# Patient Record
Sex: Female | Born: 1960 | Race: White | Hispanic: No | Marital: Married | Smoking: Former smoker
Health system: Southern US, Community
[De-identification: ages and names within clinical notes are randomized; demographics above are authoritative.]

## PROBLEM LIST (undated history)

## (undated) DIAGNOSIS — T8859XA Other complications of anesthesia, initial encounter: Secondary | ICD-10-CM

## (undated) DIAGNOSIS — I10 Essential (primary) hypertension: Secondary | ICD-10-CM

## (undated) DIAGNOSIS — E785 Hyperlipidemia, unspecified: Secondary | ICD-10-CM

## (undated) DIAGNOSIS — C801 Malignant (primary) neoplasm, unspecified: Secondary | ICD-10-CM

## (undated) DIAGNOSIS — B019 Varicella without complication: Secondary | ICD-10-CM

## (undated) DIAGNOSIS — T4145XA Adverse effect of unspecified anesthetic, initial encounter: Secondary | ICD-10-CM

## (undated) HISTORY — DX: Essential (primary) hypertension: I10

## (undated) HISTORY — PX: COLONOSCOPY: SHX174

## (undated) HISTORY — PX: NOVASURE ABLATION: SHX5394

## (undated) HISTORY — DX: Hyperlipidemia, unspecified: E78.5

## (undated) HISTORY — DX: Varicella without complication: B01.9

---

## 2002-10-21 HISTORY — PX: ABLATION: SHX5711

## 2004-01-31 ENCOUNTER — Other Ambulatory Visit: Admission: RE | Admit: 2004-01-31 | Discharge: 2004-01-31 | Payer: Self-pay | Admitting: Obstetrics and Gynecology

## 2006-01-07 ENCOUNTER — Ambulatory Visit: Payer: Self-pay | Admitting: Obstetrics and Gynecology

## 2006-01-17 ENCOUNTER — Ambulatory Visit: Payer: Self-pay | Admitting: Obstetrics and Gynecology

## 2007-02-24 ENCOUNTER — Ambulatory Visit: Payer: Self-pay | Admitting: Obstetrics and Gynecology

## 2008-02-26 ENCOUNTER — Ambulatory Visit: Payer: Self-pay | Admitting: Obstetrics and Gynecology

## 2009-03-16 ENCOUNTER — Ambulatory Visit: Payer: Self-pay | Admitting: Obstetrics and Gynecology

## 2010-05-21 ENCOUNTER — Ambulatory Visit: Payer: Self-pay | Admitting: Internal Medicine

## 2010-06-06 ENCOUNTER — Ambulatory Visit: Payer: Self-pay | Admitting: Internal Medicine

## 2010-06-13 ENCOUNTER — Ambulatory Visit: Payer: Self-pay | Admitting: Obstetrics and Gynecology

## 2010-06-21 ENCOUNTER — Ambulatory Visit: Payer: Self-pay | Admitting: Internal Medicine

## 2010-07-21 ENCOUNTER — Ambulatory Visit: Payer: Self-pay | Admitting: Internal Medicine

## 2011-08-19 ENCOUNTER — Ambulatory Visit: Payer: Self-pay | Admitting: Obstetrics and Gynecology

## 2012-07-21 LAB — HM MAMMOGRAPHY: HM Mammogram: NORMAL

## 2012-07-26 LAB — HM PAP SMEAR: HM Pap smear: NORMAL

## 2012-08-19 ENCOUNTER — Ambulatory Visit: Payer: Self-pay | Admitting: Obstetrics and Gynecology

## 2012-08-26 ENCOUNTER — Encounter: Payer: Self-pay | Admitting: Internal Medicine

## 2012-08-26 ENCOUNTER — Ambulatory Visit (INDEPENDENT_AMBULATORY_CARE_PROVIDER_SITE_OTHER): Payer: BC Managed Care – PPO | Admitting: Internal Medicine

## 2012-08-26 ENCOUNTER — Telehealth: Payer: Self-pay | Admitting: Internal Medicine

## 2012-08-26 VITALS — BP 150/90 | HR 75 | Temp 97.8°F | Ht 66.5 in | Wt 145.5 lb

## 2012-08-26 DIAGNOSIS — L259 Unspecified contact dermatitis, unspecified cause: Secondary | ICD-10-CM

## 2012-08-26 DIAGNOSIS — I10 Essential (primary) hypertension: Secondary | ICD-10-CM | POA: Insufficient documentation

## 2012-08-26 DIAGNOSIS — Z23 Encounter for immunization: Secondary | ICD-10-CM

## 2012-08-26 DIAGNOSIS — L309 Dermatitis, unspecified: Secondary | ICD-10-CM | POA: Insufficient documentation

## 2012-08-26 DIAGNOSIS — Z0000 Encounter for general adult medical examination without abnormal findings: Secondary | ICD-10-CM

## 2012-08-26 LAB — CBC WITH DIFFERENTIAL/PLATELET
Basophils Absolute: 0 10*3/uL (ref 0.0–0.1)
Eosinophils Absolute: 0.1 10*3/uL (ref 0.0–0.7)
HCT: 40.7 % (ref 36.0–46.0)
Hemoglobin: 13.5 g/dL (ref 12.0–15.0)
Lymphs Abs: 2 10*3/uL (ref 0.7–4.0)
MCHC: 33.2 g/dL (ref 30.0–36.0)
MCV: 86.4 fl (ref 78.0–100.0)
Monocytes Absolute: 0.5 10*3/uL (ref 0.1–1.0)
Monocytes Relative: 7.8 % (ref 3.0–12.0)
Neutro Abs: 3.6 10*3/uL (ref 1.4–7.7)
RDW: 13.4 % (ref 11.5–14.6)

## 2012-08-26 LAB — COMPREHENSIVE METABOLIC PANEL
ALT: 16 U/L (ref 0–35)
AST: 16 U/L (ref 0–37)
Alkaline Phosphatase: 36 U/L — ABNORMAL LOW (ref 39–117)
Potassium: 4 mEq/L (ref 3.5–5.1)
Sodium: 141 mEq/L (ref 135–145)
Total Bilirubin: 0.6 mg/dL (ref 0.3–1.2)
Total Protein: 7 g/dL (ref 6.0–8.3)

## 2012-08-26 LAB — TSH: TSH: 0.88 u[IU]/mL (ref 0.35–5.50)

## 2012-08-26 LAB — LIPID PANEL: VLDL: 16.4 mg/dL (ref 0.0–40.0)

## 2012-08-26 MED ORDER — TRIAMCINOLONE ACETONIDE 0.1 % EX CREA: TOPICAL_CREAM | Freq: Two times a day (BID) | CUTANEOUS | Status: DC

## 2012-08-26 MED ORDER — ZOSTER VACCINE LIVE 19400 UNT/0.65ML ~~LOC~~ SOLR: 0.6500 mL | Freq: Once | SUBCUTANEOUS | Status: DC

## 2012-08-26 NOTE — Telephone Encounter (Signed)
EKG and labs normal

## 2012-08-26 NOTE — Assessment & Plan Note (Signed)
Patient will monitor blood pressure at home in one to 2 times per week and e-mail with readings. If blood pressure consistently greater than 140/90, will plan to start medication. Followup one.

## 2012-08-26 NOTE — Progress Notes (Signed)
Subjective:    Patient ID: Andrea Arnold, female    DOB: 01/21/1961, 51 y.o.   MRN: [id number]  HPI 51 year old female presents to establish care. She reports that she has been generally healthy with no major ongoing medical issues. However, over the last several years she has intermittently had some elevated blood pressure readings. She denies any headache, palpitations, chest pain. She does report significant stress in her life with ongoing issues at work. She has never taken medication for anxiety or stress.   She is concerned today about rash over her left hand and neck. This is been present for several months. She was seen at urgent care and prescribed a topical antihistamine cream. She reports no improvement with this. She reports that the rash is red and peeling and occasionally itchy. She denies use of a new lotions, soaps, detergents. She has not recently been seen by a dermatologist.  Outpatient Encounter Prescriptions as of 08/26/2012  Medication Sig Dispense Refill  . triamcinolone cream (KENALOG) 0.1 % Apply topically 2 (two) times daily.  30 g  0  . Vitamin D, Ergocalciferol, (DRISDOL) 50000 UNITS CAPS Take 50,000 Units by mouth every 7 (seven) days.      Marland Kitchen zoster vaccine live, PF, (ZOSTAVAX) 40981 UNT/0.65ML injection Inject 19,400 Units into the skin once.  1 each  0   BP 150/90  Pulse 75  Temp 97.8 F (36.6 C) (Oral)  Ht 5' 6.5" (1.689 m)  Wt 145 lb 8 oz (65.998 kg)  BMI 23.13 kg/m2  SpO2 97%  Review of Systems  Constitutional: Negative for fever, chills, appetite change, fatigue and unexpected weight change.  HENT: Negative for ear pain, congestion, sore throat, trouble swallowing, neck pain, voice change and sinus pressure.   Eyes: Negative for visual disturbance.  Respiratory: Negative for cough, shortness of breath, wheezing and stridor.   Cardiovascular: Negative for chest pain, palpitations and leg swelling.  Gastrointestinal: Negative for nausea, vomiting,  abdominal pain, diarrhea, constipation, blood in stool, abdominal distention and anal bleeding.  Genitourinary: Negative for dysuria and flank pain.  Musculoskeletal: Negative for myalgias, arthralgias and gait problem.  Skin: Positive for color change and rash.  Neurological: Negative for dizziness and headaches.  Hematological: Negative for adenopathy. Does not bruise/bleed easily.  Psychiatric/Behavioral: Negative for suicidal ideas, sleep disturbance and dysphoric mood. The patient is nervous/anxious.        Objective:   Physical Exam  Constitutional: She is oriented to person, place, and time. She appears well-developed and well-nourished. No distress.  HENT:  Head: Normocephalic and atraumatic.  Right Ear: External ear normal.  Left Ear: External ear normal.  Nose: Nose normal.  Mouth/Throat: Oropharynx is clear and moist. No oropharyngeal exudate.  Eyes: Conjunctivae normal are normal. Pupils are equal, round, and reactive to light. Right eye exhibits no discharge. Left eye exhibits no discharge. No scleral icterus.  Neck: Normal range of motion. Neck supple. No tracheal deviation present. No thyromegaly present.  Cardiovascular: Normal rate, regular rhythm, normal heart sounds and intact distal pulses.  Exam reveals no gallop and no friction rub.   No murmur heard. Pulmonary/Chest: Effort normal and breath sounds normal. No respiratory distress. She has no wheezes. She has no rales. She exhibits no tenderness.  Abdominal: Soft. Bowel sounds are normal. She exhibits no distension and no mass. There is no tenderness. There is no rebound and no guarding.  Musculoskeletal: Normal range of motion. She exhibits no edema and no tenderness.  Lymphadenopathy:    She  has no cervical adenopathy.  Neurological: She is alert and oriented to person, place, and time. No cranial nerve deficit. She exhibits normal muscle tone. Coordination normal.  Skin: Skin is warm and dry. Rash noted. Rash is  papular (erythematous scaling rash over left hand and right posterior neck). She is not diaphoretic. There is erythema. No pallor.  Psychiatric: She has a normal mood and affect. Her behavior is normal. Judgment and thought content normal.          Assessment & Plan:

## 2012-08-26 NOTE — Assessment & Plan Note (Signed)
Rash over her left hand and right posterior neck are most consistent with psoriasis. Will try adding topical triamcinolone cream. We'll also set up dermatology referral.

## 2012-09-04 ENCOUNTER — Encounter: Payer: Self-pay | Admitting: Internal Medicine

## 2012-09-04 DIAGNOSIS — Z23 Encounter for immunization: Secondary | ICD-10-CM

## 2012-09-07 ENCOUNTER — Encounter: Payer: Self-pay | Admitting: Internal Medicine

## 2012-09-07 MED ORDER — ZOSTER VACCINE LIVE 19400 UNT/0.65ML ~~LOC~~ SOLR: 0.6500 mL | Freq: Once | SUBCUTANEOUS | Status: DC

## 2012-09-23 ENCOUNTER — Ambulatory Visit (INDEPENDENT_AMBULATORY_CARE_PROVIDER_SITE_OTHER): Payer: BC Managed Care – PPO | Admitting: Internal Medicine

## 2012-09-23 ENCOUNTER — Encounter: Payer: Self-pay | Admitting: Internal Medicine

## 2012-09-23 VITALS — BP 122/82 | HR 72 | Temp 98.1°F | Resp 16 | Wt 148.5 lb

## 2012-09-23 DIAGNOSIS — E785 Hyperlipidemia, unspecified: Secondary | ICD-10-CM | POA: Insufficient documentation

## 2012-09-23 DIAGNOSIS — R9431 Abnormal electrocardiogram [ECG] [EKG]: Secondary | ICD-10-CM | POA: Insufficient documentation

## 2012-09-23 MED ORDER — ATORVASTATIN CALCIUM 10 MG PO TABS: 10.0000 mg | ORAL_TABLET | Freq: Every day | ORAL | Status: DC

## 2012-09-23 NOTE — Assessment & Plan Note (Signed)
LDL 145. We discussed both the ATPIII and new ATP IV guidelines for management of hyperlipidemia. Based on ATP IV risk calculator, pt has 1.2% risk of ASCVD over next 10 years.  She follows a healthy diet and exercises regularly. We discussed more aggressive dietary measures including limiting saturated fat and increasing intake of fiber.  Given her concern about family risk of ASCVD and likely familial HL, will start low dose atorvastatin 10mg  daily for primary prevention. Pt will have lipids and LFTs rechecked in 1-3 months.

## 2012-09-23 NOTE — Progress Notes (Signed)
Subjective:    Patient ID: Andrea Arnold, female    DOB: September 01, 1961, 51 y.o.   MRN: [id number]  HPI 51 year old female with history of hypertension presents for followup. She reports that blood pressure has been well-controlled without medication. Most blood pressures when checked near 120/80. She denies chest pain, palpitations, headache. She follows an active lifestyle and exercises for 30-60 minutes most days of the week. At her last visit, she had blood work including cholesterol checked which showed elevation of LDL at 145. She also had baseline EKG performed. EKG showed bifascicular block. She reports that she discussed these findings with her father who is a cardiologist. He felt strongly that she needs to be evaluated further for coronary artery disease. He also felt that she should be placed on a statin medication. She is a strong family history of heart disease.  At her last visit, she also complained of itchy rash over her neck and hands. She reports that she was seen by dermatology and they changed her steroid cream to a stronger medication. She reports rash is completely resolve.  Outpatient Encounter Prescriptions as of 09/23/2012  Medication Sig Dispense Refill  . clobetasol cream (TEMOVATE) 0.05 %       . Vitamin D, Ergocalciferol, (DRISDOL) 50000 UNITS CAPS Take 50,000 Units by mouth every 7 (seven) days.      Marland Kitchen zoster vaccine live, PF, (ZOSTAVAX) 78295 UNT/0.65ML injection Inject 19,400 Units into the skin once.  1 each  0     Review of Systems  Constitutional: Negative for fever, chills, appetite change, fatigue and unexpected weight change.  HENT: Negative for ear pain, congestion, sore throat, trouble swallowing, neck pain, voice change and sinus pressure.   Eyes: Negative for visual disturbance.  Respiratory: Negative for cough, shortness of breath, wheezing and stridor.   Cardiovascular: Negative for chest pain, palpitations and leg swelling.  Gastrointestinal: Negative  for nausea, vomiting, abdominal pain, diarrhea, constipation, blood in stool, abdominal distention and anal bleeding.  Genitourinary: Negative for dysuria and flank pain.  Musculoskeletal: Negative for myalgias, arthralgias and gait problem.  Skin: Negative for color change and rash.  Neurological: Negative for dizziness and headaches.  Hematological: Negative for adenopathy. Does not bruise/bleed easily.  Psychiatric/Behavioral: Negative for suicidal ideas, sleep disturbance and dysphoric mood. The patient is not nervous/anxious.    BP 122/82  Pulse 72  Temp 98.1 F (36.7 C) (Oral)  Resp 16  Wt 148 lb 8 oz (67.359 kg)     Objective:   Physical Exam  Constitutional: She is oriented to person, place, and time. She appears well-developed and well-nourished. No distress.  HENT:  Head: Normocephalic and atraumatic.  Right Ear: External ear normal.  Left Ear: External ear normal.  Nose: Nose normal.  Mouth/Throat: Oropharynx is clear and moist. No oropharyngeal exudate.  Eyes: Conjunctivae normal are normal. Pupils are equal, round, and reactive to light. Right eye exhibits no discharge. Left eye exhibits no discharge. No scleral icterus.  Neck: Normal range of motion. Neck supple. No tracheal deviation present. No thyromegaly present.  Cardiovascular: Normal rate, regular rhythm, normal heart sounds and intact distal pulses.  Exam reveals no gallop and no friction rub.   No murmur heard. Pulmonary/Chest: Effort normal and breath sounds normal. No respiratory distress. She has no wheezes. She has no rales. She exhibits no tenderness.  Musculoskeletal: Normal range of motion. She exhibits no edema and no tenderness.  Lymphadenopathy:    She has no cervical adenopathy.  Neurological: She is  alert and oriented to person, place, and time. No cranial nerve deficit. She exhibits normal muscle tone. Coordination normal.  Skin: Skin is warm and dry. No rash noted. She is not diaphoretic. No  erythema. No pallor.  Psychiatric: She has a normal mood and affect. Her behavior is normal. Judgment and thought content normal.          Assessment & Plan:

## 2012-09-23 NOTE — Assessment & Plan Note (Signed)
Reviewed EKG with patient today. EKG shows bifascicular block. We discussed that this can be a normal finding. Given her concern about familial risk of cardiovascular disease, will set up cardiology evaluation. Patient would like to have further risk stratification with stress test.

## 2012-09-24 ENCOUNTER — Encounter: Payer: Self-pay | Admitting: Internal Medicine

## 2012-09-28 ENCOUNTER — Ambulatory Visit: Payer: BC Managed Care – PPO | Admitting: Cardiovascular Disease

## 2012-10-01 ENCOUNTER — Encounter: Payer: Self-pay | Admitting: Cardiovascular Disease

## 2012-10-01 ENCOUNTER — Ambulatory Visit (INDEPENDENT_AMBULATORY_CARE_PROVIDER_SITE_OTHER): Payer: BC Managed Care – PPO | Admitting: Cardiovascular Disease

## 2012-10-01 VITALS — BP 122/82 | HR 67 | Ht 66.5 in | Wt 146.5 lb

## 2012-10-01 VITALS — BP 124/92 | HR 67 | Ht 66.5 in | Wt 146.5 lb

## 2012-10-01 DIAGNOSIS — I10 Essential (primary) hypertension: Secondary | ICD-10-CM

## 2012-10-01 DIAGNOSIS — R9431 Abnormal electrocardiogram [ECG] [EKG]: Secondary | ICD-10-CM

## 2012-10-01 DIAGNOSIS — E785 Hyperlipidemia, unspecified: Secondary | ICD-10-CM

## 2012-10-01 NOTE — Assessment & Plan Note (Signed)
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 Overall no  symptoms of angina, heart failure or arrhythmia. Her cardiac physical exam is normal. Given her risk factors and family history of CAD, I recommend a treadmill stress test for evaluation.

## 2012-10-01 NOTE — Patient Instructions (Addendum)
Your physician has requested that you have an exercise tolerance test. For further information please visit [email or web address]. Please also follow instruction sheet, as given.  Follow up as needed.  

## 2012-10-01 NOTE — Patient Instructions (Addendum)
Your stress test is normal.  Follow up as needed.  

## 2012-10-01 NOTE — Procedures (Signed)
    Treadmill Stress test  Indication: Abnormal ECG.  Baseline Data:  Resting EKG shows NSR with rate of 65 bpm, left anterior fascicular block. Resting blood pressure of 122/82 mm Hg Stand bruce protocal was used.  Exercise Data:  Patient exercised for 9 min 25 sec,  Peak heart rate of 165 bpm.  This was 98 % of the maximum predicted heart rate. No symptoms of chest pain or lightheadedness were reported at peak stress or in recovery.  Peak Blood pressure recorded was 164/88 Maximal work level: 10.1 METs.  Heart rate at 3 minutes in recovery was 80 bpm. BP response: Normal HR response: Normal  EKG with Exercise: Sinus tachycardia with no significant ST changes.  FINAL IMPRESSION: Normal exercise stress test. No significant EKG changes concerning for ischemia. Excellent exercise tolerance.

## 2012-10-01 NOTE — Assessment & Plan Note (Signed)
She was started recently on atorvastatin 10 mg daily and seems to be tolerating it well.

## 2012-10-01 NOTE — Progress Notes (Signed)
HPI  This is a 51 year old pleasant female who is referred by Dr. Dan Humphreys for an abnormal EKG. The patient has no previous cardiac history. She has known history of familial hyperlipidemia and family history of coronary artery disease. Both grandparents died of myocardial infarction in their early 32s. She has borderline hypertension but no history of diabetes. She was noted recently to have an abnormal ECG with incomplete right bundle branch block and left anterior fascicular block. She denies any chest pain. She exercises regularly with only occasional mild dyspnea. No palpitations, syncope or presyncope. She was started on atorvastatin recently without any reported side effects. Her father is a cardiologist in [place]. She works as a Radio producer at [employer].   No Known Allergies   Current Outpatient Prescriptions on File Prior to Visit  Medication Sig Dispense Refill  . atorvastatin (LIPITOR) 10 MG tablet Take 1 tablet (10 mg total) by mouth daily.  30 tablet  6  . clobetasol cream (TEMOVATE) 0.05 % 2 (two) times daily.       . Vitamin D, Ergocalciferol, (DRISDOL) 50000 UNITS CAPS Take 50,000 Units by mouth every 7 (seven) days.      Marland Kitchen zoster vaccine live, PF, (ZOSTAVAX) 57846 UNT/0.65ML injection Inject 19,400 Units into the skin once.  1 each  0     Past Medical History  Diagnosis Date  . Chicken pox   . Hypertension   . Hyperlipidemia      Past Surgical History  Procedure Date  . Ablation 2004  . Novasure ablation      Family History  Problem Relation Age of Onset  . Alcohol abuse Sister   . Drug abuse Sister   . Cancer Sister 13    ovarian   . Mental illness Sister   . Cancer Maternal Aunt     breast  . Hyperlipidemia Maternal Grandmother   . Heart disease Maternal Grandmother   . Hypertension Maternal Grandmother   . Hyperlipidemia Maternal Grandfather   . Heart disease Maternal Grandfather   . Hypertension Maternal Grandfather   . Cancer  Cousin     breast  . Hypertension Father   . Hyperlipidemia Mother      History   Social History  . Marital Status: Married    Spouse Name: N/A    Number of Children: N/A  . Years of Education: N/A   Occupational History  . Not on file.   Social History Main Topics  . Smoking status: Former Smoker -- 1.0 packs/day for 10 years    Types: Cigarettes  . Smokeless tobacco: Not on file  . Alcohol Use: No  . Drug Use: No  . Sexually Active: Not on file   Other Topics Concern  . Not on file   Social History Narrative   [place] and 2 daughters. 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  HENT: Negative for hearing loss, nosebleeds, congestion, sore throat, facial swelling, drooling, trouble swallowing, neck pain, voice change, sinus pressure and tinnitus.  Eyes: Negative for photophobia, pain, discharge and visual disturbance.  Respiratory: Negative for apnea, cough, chest tightness and wheezing.  Cardiovascular: Negative for chest pain, palpitations and leg swelling.  Gastrointestinal: Negative for nausea, vomiting, abdominal pain, diarrhea, constipation, blood in stool and abdominal distention.  Genitourinary: Negative for dysuria, urgency,  frequency, hematuria and decreased urine volume.  Musculoskeletal: Negative for myalgias, back pain, joint swelling, arthralgias and gait problem.  Skin: Negative for color change, pallor, rash and wound.  Neurological: Negative for dizziness, tremors, seizures, syncope, speech difficulty, weakness, light-headedness, numbness and headaches.  Psychiatric/Behavioral: Negative for suicidal ideas, hallucinations, behavioral problems and agitation. The patient is not nervous/anxious.     PHYSICAL EXAM   BP 124/92  Pulse 67  Ht 5' 6.5" (1.689  m)  Wt 146 lb 8 oz (66.452 kg)  BMI 23.29 kg/m2 Constitutional: She is oriented to person, place, and time. She appears well-developed and well-nourished. No distress.  HENT: No nasal discharge.  Head: Normocephalic and atraumatic.  Eyes: Pupils are equal and round. Right eye exhibits no discharge. Left eye exhibits no discharge.  Neck: Normal range of motion. Neck supple. No JVD present. No thyromegaly present.  Cardiovascular: Normal rate, regular rhythm, normal heart sounds. Exam reveals no gallop and no friction rub. No murmur heard.  Pulmonary/Chest: Effort normal and breath sounds normal. No stridor. No respiratory distress. She has no wheezes. She has no rales. She exhibits no tenderness.  Abdominal: Soft. Bowel sounds are normal. She exhibits no distension. There is no tenderness. There is no rebound and no guarding.  Musculoskeletal: Normal range of motion. She exhibits no edema and no tenderness.  Neurological: She is alert and oriented to person, place, and time. Coordination normal.  Skin: Skin is warm and dry. No rash noted. She is not diaphoretic. No erythema. No pallor.  Psychiatric: She has a normal mood and affect. Her behavior is normal. Judgment and thought content normal.     EKG: Sinus  Rhythm  -RSR(V1) -nondiagnostic.   -Left axis -anterior fascicular block.   ABNORMAL    ASSESSMENT AND PLAN

## 2012-10-29 ENCOUNTER — Encounter: Payer: Self-pay | Admitting: Internal Medicine

## 2012-10-29 ENCOUNTER — Ambulatory Visit (INDEPENDENT_AMBULATORY_CARE_PROVIDER_SITE_OTHER): Payer: BC Managed Care – PPO | Admitting: Internal Medicine

## 2012-10-29 VITALS — BP 112/80 | HR 70 | Temp 97.6°F | Ht 66.5 in | Wt 145.8 lb

## 2012-10-29 DIAGNOSIS — E785 Hyperlipidemia, unspecified: Secondary | ICD-10-CM

## 2012-10-29 LAB — COMPREHENSIVE METABOLIC PANEL
Alkaline Phosphatase: 36 U/L — ABNORMAL LOW (ref 39–117)
Creatinine, Ser: 1 mg/dL (ref 0.4–1.2)
GFR: 60.7 mL/min (ref >60.00)
Glucose, Bld: 95 mg/dL (ref 70–99)
Sodium: 138 mEq/L (ref 135–145)
Total Bilirubin: 0.9 mg/dL (ref 0.3–1.2)
Total Protein: 6.9 g/dL (ref 6.0–8.3)

## 2012-10-29 LAB — LIPID PANEL
Cholesterol: 167 mg/dL (ref 0–200)
HDL: 71.5 mg/dL (ref >39.00)
LDL Cholesterol: 85 mg/dL (ref 0–99)
Triglycerides: 55 mg/dL (ref 0.0–149.0)
VLDL: 11 mg/dL (ref 0.0–40.0)

## 2012-10-29 MED ORDER — OSELTAMIVIR PHOSPHATE 75 MG PO CAPS: 75.0000 mg | ORAL_CAPSULE | Freq: Two times a day (BID) | ORAL | Status: DC

## 2012-10-29 MED ORDER — CIPROFLOXACIN HCL 500 MG PO TABS: 500.0000 mg | ORAL_TABLET | Freq: Two times a day (BID) | ORAL | Status: DC

## 2012-10-29 NOTE — Progress Notes (Signed)
  Subjective:    Patient ID: Andrea Arnold, female    DOB: 02-Mar-1961, 52 y.o.   MRN: [id number]  HPI 52 year old female with history of hyperlipidemia presents for followup. She reports she is generally doing well. In the interim since her last visit, she was evaluated by cardiology and had stress test which was normal. She reports full compliance with her atorvastatin. She denies any side effects from this medication such as myalgia. She follows a healthy diet and gets regular physical activity. She is planning an upcoming trip to [place] next week.  Outpatient Encounter Prescriptions as of 10/29/2012  Medication Sig Dispense Refill  . atorvastatin (LIPITOR) 10 MG tablet Take 1 tablet (10 mg total) by mouth daily.  30 tablet  6  . clobetasol cream (TEMOVATE) 0.05 % 2 (two) times daily.       . Vitamin D, Ergocalciferol, (DRISDOL) 50000 UNITS CAPS Take 50,000 Units by mouth every 7 (seven) days.       BP 112/80  Pulse 70  Temp 97.6 F (36.4 C) (Oral)  Ht 5' 6.5" (1.689 m)  Wt 145 lb 12 oz (66.112 kg)  BMI 23.17 kg/m2  SpO2 98%  Review of Systems  Constitutional: Negative for fever, chills, appetite change, fatigue and unexpected weight change.  HENT: Negative for ear pain, congestion, sore throat, trouble swallowing, neck pain, voice change and sinus pressure.   Eyes: Negative for visual disturbance.  Respiratory: Negative for cough, shortness of breath, wheezing and stridor.   Cardiovascular: Negative for chest pain, palpitations and leg swelling.  Gastrointestinal: Negative for nausea, vomiting, abdominal pain, diarrhea, constipation, blood in stool, abdominal distention and anal bleeding.  Genitourinary: Negative for dysuria and flank pain.  Musculoskeletal: Negative for myalgias, arthralgias and gait problem.  Skin: Negative for color change and rash.  Neurological: Negative for dizziness and headaches.  Hematological: Negative for adenopathy. Does not bruise/bleed easily.    Psychiatric/Behavioral: Negative for suicidal ideas, sleep disturbance and dysphoric mood. The patient is not nervous/anxious.        Objective:   Physical Exam  Constitutional: She is oriented to person, place, and time. She appears well-developed and well-nourished. No distress.  HENT:  Head: Normocephalic and atraumatic.  Right Ear: External ear normal.  Left Ear: External ear normal.  Nose: Nose normal.  Mouth/Throat: Oropharynx is clear and moist. No oropharyngeal exudate.  Eyes: Conjunctivae normal are normal. Pupils are equal, round, and reactive to light. Right eye exhibits no discharge. Left eye exhibits no discharge. No scleral icterus.  Neck: Normal range of motion. Neck supple. No tracheal deviation present. No thyromegaly present.  Cardiovascular: Normal rate, regular rhythm, normal heart sounds and intact distal pulses.  Exam reveals no gallop and no friction rub.   No murmur heard. Pulmonary/Chest: Effort normal and breath sounds normal. No respiratory distress. She has no wheezes. She has no rales. She exhibits no tenderness.  Musculoskeletal: Normal range of motion. She exhibits no edema and no tenderness.  Lymphadenopathy:    She has no cervical adenopathy.  Neurological: She is alert and oriented to person, place, and time. No cranial nerve deficit. She exhibits normal muscle tone. Coordination normal.  Skin: Skin is warm and dry. No rash noted. She is not diaphoretic. No erythema. No pallor.  Psychiatric: She has a normal mood and affect. Her behavior is normal. Judgment and thought content normal.          Assessment & Plan:

## 2012-10-29 NOTE — Assessment & Plan Note (Signed)
Will recheck liver function test and lipids with labs today. Continue atorvastatin for primary prevention.

## 2012-12-05 ENCOUNTER — Other Ambulatory Visit: Payer: Self-pay

## 2013-04-01 ENCOUNTER — Telehealth: Payer: Self-pay | Admitting: Internal Medicine

## 2013-04-01 ENCOUNTER — Encounter: Payer: Self-pay | Admitting: Internal Medicine

## 2013-04-01 NOTE — Telephone Encounter (Signed)
Andrea Arnold called she is wanting her 17 yr. Old to be scheduled as a new patient with you. Is this ok.

## 2013-04-02 NOTE — Telephone Encounter (Signed)
I think she sent you a mychart message also about this.

## 2013-04-02 NOTE — Telephone Encounter (Signed)
I had sent a message to Robin. Fine to schedule her daughter.

## 2013-07-12 ENCOUNTER — Other Ambulatory Visit: Payer: Self-pay | Admitting: Internal Medicine

## 2013-07-12 NOTE — Telephone Encounter (Signed)
Eprescribed.

## 2013-08-01 LAB — HM PAP SMEAR

## 2013-08-26 ENCOUNTER — Other Ambulatory Visit: Payer: Self-pay

## 2013-09-28 ENCOUNTER — Ambulatory Visit: Payer: Self-pay | Admitting: Obstetrics and Gynecology

## 2013-09-28 LAB — HM MAMMOGRAPHY

## 2013-10-21 HISTORY — PX: CERVICAL BIOPSY  W/ LOOP ELECTRODE EXCISION: SUR135

## 2013-11-01 ENCOUNTER — Ambulatory Visit (INDEPENDENT_AMBULATORY_CARE_PROVIDER_SITE_OTHER): Payer: BC Managed Care – PPO | Admitting: Internal Medicine

## 2013-11-01 ENCOUNTER — Telehealth: Payer: Self-pay | Admitting: Internal Medicine

## 2013-11-01 ENCOUNTER — Encounter: Payer: Self-pay | Admitting: Internal Medicine

## 2013-11-01 VITALS — BP 104/80 | HR 62 | Temp 97.9°F | Ht 65.5 in | Wt 149.0 lb

## 2013-11-01 DIAGNOSIS — L259 Unspecified contact dermatitis, unspecified cause: Secondary | ICD-10-CM

## 2013-11-01 DIAGNOSIS — Z0000 Encounter for general adult medical examination without abnormal findings: Secondary | ICD-10-CM | POA: Insufficient documentation

## 2013-11-01 DIAGNOSIS — E785 Hyperlipidemia, unspecified: Secondary | ICD-10-CM

## 2013-11-01 DIAGNOSIS — L309 Dermatitis, unspecified: Secondary | ICD-10-CM | POA: Insufficient documentation

## 2013-11-01 LAB — CBC WITH DIFFERENTIAL/PLATELET
BASOS ABS: 0 10*3/uL (ref 0.0–0.1)
BASOS PCT: 0.4 % (ref 0.0–3.0)
Eosinophils Absolute: 0.1 10*3/uL (ref 0.0–0.7)
Eosinophils Relative: 1.6 % (ref 0.0–5.0)
HCT: 40 % (ref 36.0–46.0)
HEMOGLOBIN: 13.6 g/dL (ref 12.0–15.0)
Lymphocytes Relative: 39.3 % (ref 12.0–46.0)
Lymphs Abs: 1.7 10*3/uL (ref 0.7–4.0)
MCHC: 33.9 g/dL (ref 30.0–36.0)
MCV: 84.8 fl (ref 78.0–100.0)
MONOS PCT: 6.1 % (ref 3.0–12.0)
Monocytes Absolute: 0.3 10*3/uL (ref 0.1–1.0)
NEUTROS ABS: 2.3 10*3/uL (ref 1.4–7.7)
Neutrophils Relative %: 52.6 % (ref 43.0–77.0)
Platelets: 229 10*3/uL (ref 150.0–400.0)
RBC: 4.72 Mil/uL (ref 3.87–5.11)
RDW: 13.4 % (ref 11.5–14.6)
WBC: 4.4 10*3/uL — ABNORMAL LOW (ref 4.5–10.5)

## 2013-11-01 NOTE — Progress Notes (Signed)
Subjective:    Patient ID: Andrea Arnold, female    DOB: [birth year], 53 y.o.   MRN: [id number]  HPI 53 year old female with history of hyperlipidemia presents for annual exam. She reports she is generally feeling well. She is physically active, exercising daily. She is following a Mediterranean style diet. Her only concern today is persistent scaling over her left palm. She was evaluated by dermatology and diagnosed with dyshidrotic eczema. She was started on clobetasol cream. She's had no improvement with this. She is also trying to use topical emollient creams with no improvement.  Outpatient Encounter Prescriptions as of 11/01/2013  Medication Sig  . atorvastatin (LIPITOR) 10 MG tablet TAKE 1 TABLET (10 MG TOTAL) BY MOUTH DAILY.  . cholecalciferol (VITAMIN D) 1000 UNITS tablet Take 1,000 Units by mouth daily.  . clobetasol cream (TEMOVATE) 0.05 % 2 (two) times daily.   . Multiple Vitamin (MULTIVITAMIN) tablet Take 1 tablet by mouth daily.   BP 104/80  Pulse 62  Temp(Src) 97.9 F (36.6 C) (Oral)  Ht 5' 5.5" (1.664 m)  Wt 149 lb (67.586 kg)  BMI 24.41 kg/m2  SpO2 98%  Review of Systems  Constitutional: Negative for fever, chills, appetite change, fatigue and unexpected weight change.  HENT: Negative for congestion, ear pain, sinus pressure, sore throat, trouble swallowing and voice change.   Eyes: Negative for visual disturbance.  Respiratory: Negative for cough, shortness of breath, wheezing and stridor.   Cardiovascular: Negative for chest pain, palpitations and leg swelling.  Gastrointestinal: Negative for nausea, vomiting, abdominal pain, diarrhea, constipation, blood in stool, abdominal distention and anal bleeding.  Genitourinary: Negative for dysuria and flank pain.  Musculoskeletal: Negative for arthralgias, gait problem, myalgias and neck pain.  Skin: Positive for rash (scaling left palm). Negative for color change.  Neurological: Negative for dizziness and headaches.    Hematological: Negative for adenopathy. Does not bruise/bleed easily.  Psychiatric/Behavioral: Negative for suicidal ideas, sleep disturbance and dysphoric mood. The patient is not nervous/anxious.        Objective:   Physical Exam  Constitutional: She is oriented to person, place, and time. She appears well-developed and well-nourished. No distress.  HENT:  Head: Normocephalic and atraumatic.  Right Ear: External ear normal.  Left Ear: External ear normal.  Nose: Nose normal.  Mouth/Throat: Oropharynx is clear and moist. No oropharyngeal exudate.  Eyes: Conjunctivae are normal. Pupils are equal, round, and reactive to light. Right eye exhibits no discharge. Left eye exhibits no discharge. No scleral icterus.  Neck: Normal range of motion. Neck supple. No tracheal deviation present. No thyromegaly present.  Cardiovascular: Normal rate, regular rhythm, normal heart sounds and intact distal pulses.  Exam reveals no gallop and no friction rub.   No murmur heard. Pulmonary/Chest: Effort normal and breath sounds normal. No accessory muscle usage. Not tachypneic. No respiratory distress. She has no decreased breath sounds. She has no wheezes. She has no rhonchi. She has no rales. She exhibits no tenderness.  Abdominal: Soft. Bowel sounds are normal. She exhibits no distension and no mass. There is no tenderness. There is no rebound and no guarding.  Musculoskeletal: Normal range of motion. She exhibits no edema and no tenderness.  Lymphadenopathy:    She has no cervical adenopathy.  Neurological: She is alert and oriented to person, place, and time. No cranial nerve deficit. She exhibits normal muscle tone. Coordination normal.  Skin: Skin is warm and dry. Rash noted. Rash is pustular (left palm with minimal erythema). She is not diaphoretic.  No erythema. No pallor.  Psychiatric: She has a normal mood and affect. Her behavior is normal. Judgment and thought content normal.           Assessment & Plan:

## 2013-11-01 NOTE — Telephone Encounter (Signed)
Flu shot

## 2013-11-01 NOTE — Assessment & Plan Note (Signed)
General medical exam normal today. Pelvic and breast exam deferred as recently completed by her OB/GYN. Will try to get recent PAP report. Mammogram UTD, will request report. Colonoscopy UTD. Immunizations UTD, except for flu vaccine, which we will give today. Will check labs including CBC, CMP, lipids, TSH, Vit D. Encouraged continued healthy diet and regular exercise. Follow up 1 year and prn.

## 2013-11-01 NOTE — Progress Notes (Signed)
Pre-visit discussion using our clinic review tool. No additional management support is needed unless otherwise documented below in the visit note.  

## 2013-11-01 NOTE — Assessment & Plan Note (Signed)
Will check LFTs and lipids with labs today. Continue Atorvastatin. 

## 2013-11-01 NOTE — Assessment & Plan Note (Signed)
Reviewed recent dermatology notes with patient today. Initial diagnosis per dermatology was dyshidrotic eczema. Question if this may actually be psoriasis. We discussed possible second opinion. She will think about this and let us know if she would like referral.

## 2013-11-02 LAB — VITAMIN D 25 HYDROXY (VIT D DEFICIENCY, FRACTURES): VIT D 25 HYDROXY: 30 ng/mL (ref 30–89)

## 2013-11-03 LAB — LIPID PANEL
Cholesterol: 175 mg/dL (ref 0–200)
HDL: 80.9 mg/dL (ref >39.00)
LDL CALC: 85 mg/dL (ref 0–99)
Total CHOL/HDL Ratio: 2
Triglycerides: 46 mg/dL (ref 0.0–149.0)
VLDL: 9.2 mg/dL (ref 0.0–40.0)

## 2013-11-03 LAB — COMPREHENSIVE METABOLIC PANEL
ALBUMIN: 4.9 g/dL (ref 3.5–5.2)
ALT: 19 U/L (ref 0–35)
AST: 23 U/L (ref 0–37)
Alkaline Phosphatase: 40 U/L (ref 39–117)
BUN: 20 mg/dL (ref 6–23)
CALCIUM: 10.4 mg/dL (ref 8.4–10.5)
CHLORIDE: 113 meq/L — AB (ref 96–112)
CO2: 24 meq/L (ref 19–32)
Creatinine, Ser: 1.1 mg/dL (ref 0.4–1.2)
GFR: 54.84 mL/min — AB (ref >60.00)
Glucose, Bld: 93 mg/dL (ref 70–99)
Potassium: 4.7 mEq/L (ref 3.5–5.1)
SODIUM: 149 meq/L — AB (ref 135–145)
TOTAL PROTEIN: 7.1 g/dL (ref 6.0–8.3)
Total Bilirubin: 0.5 mg/dL (ref 0.3–1.2)

## 2013-11-03 LAB — TSH: TSH: 1.13 u[IU]/mL (ref 0.35–5.50)

## 2013-11-09 ENCOUNTER — Telehealth: Payer: Self-pay | Admitting: Internal Medicine

## 2013-11-09 NOTE — Telephone Encounter (Signed)
See below my chart message  Is it ok to schedule    I just need to come in for flu and tetanus shots -- AND I need a redo on some blood work to see if I am still a bit dehydrated. (I don't believe I need to see the doctor.) Thank you, Andrea Arnold

## 2013-11-09 NOTE — Telephone Encounter (Signed)
Yes, just needs lab visit and immunizations.

## 2013-11-09 NOTE — Telephone Encounter (Signed)
Please advise 

## 2013-11-09 NOTE — Telephone Encounter (Signed)
Patient was sent a Mychart message in reference to this.

## 2014-02-26 ENCOUNTER — Other Ambulatory Visit: Payer: Self-pay | Admitting: Internal Medicine

## 2014-06-03 ENCOUNTER — Encounter: Payer: Self-pay | Admitting: Internal Medicine

## 2014-07-20 LAB — HM PAP SMEAR

## 2014-11-03 ENCOUNTER — Encounter: Payer: Self-pay | Admitting: Internal Medicine

## 2014-11-03 ENCOUNTER — Ambulatory Visit (INDEPENDENT_AMBULATORY_CARE_PROVIDER_SITE_OTHER): Payer: BLUE CROSS/BLUE SHIELD | Admitting: Internal Medicine

## 2014-11-03 VITALS — BP 116/81 | HR 73 | Temp 97.4°F | Ht 65.25 in | Wt 151.2 lb

## 2014-11-03 DIAGNOSIS — Z0000 Encounter for general adult medical examination without abnormal findings: Secondary | ICD-10-CM

## 2014-11-03 LAB — CBC WITH DIFFERENTIAL/PLATELET
Basophils Absolute: 0 10*3/uL (ref 0.0–0.1)
Basophils Relative: 0.5 % (ref 0.0–3.0)
EOS ABS: 0.1 10*3/uL (ref 0.0–0.7)
Eosinophils Relative: 1.5 % (ref 0.0–5.0)
HEMATOCRIT: 41.5 % (ref 36.0–46.0)
Hemoglobin: 13.7 g/dL (ref 12.0–15.0)
Lymphocytes Relative: 37.7 % (ref 12.0–46.0)
Lymphs Abs: 1.9 10*3/uL (ref 0.7–4.0)
MCHC: 33 g/dL (ref 30.0–36.0)
MCV: 86.5 fl (ref 78.0–100.0)
Monocytes Absolute: 0.4 10*3/uL (ref 0.1–1.0)
Monocytes Relative: 7.9 % (ref 3.0–12.0)
NEUTROS ABS: 2.6 10*3/uL (ref 1.4–7.7)
Neutrophils Relative %: 52.4 % (ref 43.0–77.0)
PLATELETS: 241 10*3/uL (ref 150.0–400.0)
RBC: 4.8 Mil/uL (ref 3.87–5.11)
RDW: 13.4 % (ref 11.5–15.5)
WBC: 4.9 10*3/uL (ref 4.0–10.5)

## 2014-11-03 LAB — COMPREHENSIVE METABOLIC PANEL
ALK PHOS: 41 U/L (ref 39–117)
ALT: 14 U/L (ref 0–35)
AST: 16 U/L (ref 0–37)
Albumin: 4.4 g/dL (ref 3.5–5.2)
BILIRUBIN TOTAL: 0.4 mg/dL (ref 0.2–1.2)
BUN: 25 mg/dL — ABNORMAL HIGH (ref 6–23)
CALCIUM: 9.8 mg/dL (ref 8.4–10.5)
CHLORIDE: 104 meq/L (ref 96–112)
CO2: 27 meq/L (ref 19–32)
Creatinine, Ser: 1.08 mg/dL (ref 0.40–1.20)
GFR: 56.38 mL/min — AB (ref >60.00)
Glucose, Bld: 95 mg/dL (ref 70–99)
POTASSIUM: 4.2 meq/L (ref 3.5–5.1)
SODIUM: 138 meq/L (ref 135–145)
Total Protein: 7.2 g/dL (ref 6.0–8.3)

## 2014-11-03 LAB — MICROALBUMIN / CREATININE URINE RATIO
CREATININE, U: 138.5 mg/dL
Microalb Creat Ratio: 3.9 mg/g (ref 0.0–30.0)
Microalb, Ur: 5.4 mg/dL — ABNORMAL HIGH (ref 0.0–1.9)

## 2014-11-03 LAB — LIPID PANEL
Cholesterol: 166 mg/dL (ref 0–200)
HDL: 69.4 mg/dL (ref >39.00)
LDL CALC: 85 mg/dL (ref 0–99)
NonHDL: 96.6
TRIGLYCERIDES: 56 mg/dL (ref 0.0–149.0)
Total CHOL/HDL Ratio: 2
VLDL: 11.2 mg/dL (ref 0.0–40.0)

## 2014-11-03 LAB — TSH: TSH: 2.05 u[IU]/mL (ref 0.35–4.50)

## 2014-11-03 LAB — VITAMIN D 25 HYDROXY (VIT D DEFICIENCY, FRACTURES): VITD: 18.32 ng/mL — ABNORMAL LOW (ref 30.00–100.00)

## 2014-11-03 MED ORDER — ATORVASTATIN CALCIUM 10 MG PO TABS: 10.0000 mg | ORAL_TABLET | Freq: Every day | ORAL | Status: DC

## 2014-11-03 NOTE — Assessment & Plan Note (Signed)
General medical exam normal today. Pelvic and breast exam deferred as recently completed by OB. Reviewed results from recent LEEP procedure. Mammogram ordered. Colonoscopy UTD. Immunizations UTD. Encourage continued healthy diet and exercise. Labs today including CBC, CMP, lipids, TSH, Vit D.

## 2014-11-03 NOTE — Progress Notes (Signed)
Subjective:    Patient ID: Andrea Arnold, female    DOB: 08/20/1961, 54 y.o.   MRN: [id number]  HPI 54YO female presents for physical exam.  PAP abnormal in 06/2014. Now s/p LEEP. Has follow up planned with GYN in 1 year.  Feeling well. No concerns today. Tries to follow healthy diet and exercise regularly.  Wt Readings from Last 3 Encounters:  11/03/14 151 lb 4 oz (68.607 kg)  11/01/13 149 lb (67.586 kg)  10/29/12 145 lb 12 oz (66.112 kg)    Past medical, surgical, family and social history per today's encounter.  Review of Systems  Constitutional: Negative for fever, chills, appetite change, fatigue and unexpected weight change.  Eyes: Negative for visual disturbance.  Respiratory: Negative for cough and shortness of breath.   Cardiovascular: Negative for chest pain and leg swelling.  Gastrointestinal: Negative for nausea, vomiting, abdominal pain, diarrhea and constipation.  Musculoskeletal: Negative for myalgias and arthralgias.  Skin: Negative for color change and rash.  Hematological: Negative for adenopathy. Does not bruise/bleed easily.  Psychiatric/Behavioral: Negative for sleep disturbance and dysphoric mood. The patient is not nervous/anxious.        Objective:    BP 116/81 mmHg  Pulse 73  Temp(Src) 97.4 F (36.3 C) (Oral)  Ht 5' 5.25" (1.657 m)  Wt 151 lb 4 oz (68.607 kg)  BMI 24.99 kg/m2  SpO2 98% Physical Exam  Constitutional: She is oriented to person, place, and time. She appears well-developed and well-nourished. No distress.  HENT:  Head: Normocephalic and atraumatic.  Right Ear: External ear normal.  Left Ear: External ear normal.  Nose: Nose normal.  Mouth/Throat: Oropharynx is clear and moist. No oropharyngeal exudate.  Eyes: Conjunctivae and EOM are normal. Pupils are equal, round, and reactive to light. Right eye exhibits no discharge.  Neck: Normal range of motion. Neck supple. No thyromegaly present.  Cardiovascular: Normal rate,  regular rhythm, normal heart sounds and intact distal pulses.  Exam reveals no gallop and no friction rub.   No murmur heard. Pulmonary/Chest: Effort normal. No respiratory distress. She has no wheezes. She has no rales.  Abdominal: Soft. Bowel sounds are normal. She exhibits no distension and no mass. There is no tenderness. There is no rebound and no guarding.  Musculoskeletal: Normal range of motion. She exhibits no edema or tenderness.  Lymphadenopathy:    She has no cervical adenopathy.  Neurological: She is alert and oriented to person, place, and time. No cranial nerve deficit. Coordination normal.  Skin: Skin is warm and dry. No rash noted. She is not diaphoretic. No erythema. No pallor.  Psychiatric: She has a normal mood and affect. Her behavior is normal. Judgment and thought content normal.          Assessment & Plan:   Problem List Items Addressed This Visit      Unprioritized   Routine general medical examination at health care facility - [facility]    General medical exam normal today. Pelvic and breast exam deferred as recently completed by OB. Reviewed results from recent LEEP procedure. Mammogram ordered. Colonoscopy UTD. Immunizations UTD. Encourage continued healthy diet and exercise. Labs today including CBC, CMP, lipids, TSH, Vit D.      Relevant Orders   MM Digital Screening   CBC with Differential   Comprehensive metabolic panel   Lipid panel   Microalbumin / creatinine urine ratio   Vit D  25 hydroxy (rtn osteoporosis monitoring)   TSH  Return in about 1 year (around 11/04/2015) for Physical.

## 2014-11-03 NOTE — Patient Instructions (Signed)

## 2014-11-03 NOTE — Progress Notes (Signed)
Pre visit review using our clinic review tool, if applicable. No additional management support is needed unless otherwise documented below in the visit note. 

## 2014-11-09 ENCOUNTER — Encounter: Payer: Self-pay | Admitting: Internal Medicine

## 2015-04-07 ENCOUNTER — Ambulatory Visit
Admission: RE | Admit: 2015-04-07 | Discharge: 2015-04-07 | Disposition: A | Discharge status: Home / Self Care | Payer: BLUE CROSS/BLUE SHIELD | Source: Ambulatory Visit | Attending: Internal Medicine | Admitting: Internal Medicine

## 2015-04-07 DIAGNOSIS — Z0000 Encounter for general adult medical examination without abnormal findings: Secondary | ICD-10-CM | POA: Insufficient documentation

## 2015-04-07 DIAGNOSIS — Z1231 Encounter for screening mammogram for malignant neoplasm of breast: Secondary | ICD-10-CM | POA: Insufficient documentation

## 2015-04-07 LAB — HM MAMMOGRAPHY

## 2015-07-03 ENCOUNTER — Encounter: Payer: Self-pay | Admitting: Internal Medicine

## 2015-07-26 LAB — HM PAP SMEAR: HM PAP: NEGATIVE

## 2015-11-07 ENCOUNTER — Encounter: Payer: Self-pay | Admitting: Internal Medicine

## 2015-11-07 ENCOUNTER — Ambulatory Visit (INDEPENDENT_AMBULATORY_CARE_PROVIDER_SITE_OTHER): Payer: BLUE CROSS/BLUE SHIELD | Admitting: Internal Medicine

## 2015-11-07 VITALS — BP 128/82 | HR 71 | Temp 98.1°F | Ht 65.5 in | Wt 157.5 lb

## 2015-11-07 DIAGNOSIS — Z0000 Encounter for general adult medical examination without abnormal findings: Secondary | ICD-10-CM

## 2015-11-07 LAB — CBC WITH DIFFERENTIAL/PLATELET
BASOS PCT: 0.4 % (ref 0.0–3.0)
Basophils Absolute: 0 10*3/uL (ref 0.0–0.1)
EOS PCT: 1.7 % (ref 0.0–5.0)
Eosinophils Absolute: 0.1 10*3/uL (ref 0.0–0.7)
HCT: 40.5 % (ref 36.0–46.0)
Hemoglobin: 13.3 g/dL (ref 12.0–15.0)
LYMPHS ABS: 1.7 10*3/uL (ref 0.7–4.0)
Lymphocytes Relative: 35.5 % (ref 12.0–46.0)
MCHC: 32.9 g/dL (ref 30.0–36.0)
MCV: 87.3 fl (ref 78.0–100.0)
MONOS PCT: 6.6 % (ref 3.0–12.0)
Monocytes Absolute: 0.3 10*3/uL (ref 0.1–1.0)
NEUTROS ABS: 2.7 10*3/uL (ref 1.4–7.7)
NEUTROS PCT: 55.8 % (ref 43.0–77.0)
PLATELETS: 233 10*3/uL (ref 150.0–400.0)
RBC: 4.63 Mil/uL (ref 3.87–5.11)
RDW: 13.5 % (ref 11.5–15.5)
WBC: 4.9 10*3/uL (ref 4.0–10.5)

## 2015-11-07 LAB — LIPID PANEL
Cholesterol: 198 mg/dL (ref 0–200)
HDL: 80.2 mg/dL (ref >39.00)
LDL Cholesterol: 108 mg/dL — ABNORMAL HIGH (ref 0–99)
NONHDL: 117.78
TRIGLYCERIDES: 48 mg/dL (ref 0.0–149.0)
Total CHOL/HDL Ratio: 2
VLDL: 9.6 mg/dL (ref 0.0–40.0)

## 2015-11-07 LAB — COMPREHENSIVE METABOLIC PANEL
ALK PHOS: 34 U/L — AB (ref 39–117)
ALT: 14 U/L (ref 0–35)
AST: 14 U/L (ref 0–37)
Albumin: 4.2 g/dL (ref 3.5–5.2)
BILIRUBIN TOTAL: 0.6 mg/dL (ref 0.2–1.2)
BUN: 19 mg/dL (ref 6–23)
CALCIUM: 9.2 mg/dL (ref 8.4–10.5)
CO2: 30 mEq/L (ref 19–32)
Chloride: 105 mEq/L (ref 96–112)
Creatinine, Ser: 0.88 mg/dL (ref 0.40–1.20)
GFR: 71.14 mL/min (ref >60.00)
Glucose, Bld: 90 mg/dL (ref 70–99)
POTASSIUM: 4.5 meq/L (ref 3.5–5.1)
Sodium: 140 mEq/L (ref 135–145)
TOTAL PROTEIN: 6.5 g/dL (ref 6.0–8.3)

## 2015-11-07 LAB — VITAMIN D 25 HYDROXY (VIT D DEFICIENCY, FRACTURES): VITD: 14.02 ng/mL — ABNORMAL LOW (ref 30.00–100.00)

## 2015-11-07 LAB — TSH: TSH: 1.56 u[IU]/mL (ref 0.35–4.50)

## 2015-11-07 MED ORDER — CIPROFLOXACIN HCL 500 MG PO TABS: 500.0000 mg | ORAL_TABLET | Freq: Two times a day (BID) | ORAL | Status: DC

## 2015-11-07 MED ORDER — ONDANSETRON HCL 8 MG PO TABS: 8.0000 mg | ORAL_TABLET | Freq: Three times a day (TID) | ORAL | Status: DC | PRN

## 2015-11-07 MED ORDER — ATORVASTATIN CALCIUM 10 MG PO TABS: 10.0000 mg | ORAL_TABLET | Freq: Every day | ORAL | Status: DC

## 2015-11-07 NOTE — Progress Notes (Signed)
Pre visit review using our clinic review tool, if applicable. No additional management support is needed unless otherwise documented below in the visit note. 

## 2015-11-07 NOTE — Progress Notes (Signed)
Subjective:    Patient ID: Andrea Arnold, female    DOB: Aug 27, 1961, 55 y.o.   MRN: [id number]  HPI  55YO female presents for physical exam.  Feeling well. No concerns today. Planning to travel to [place] this week.   Wt Readings from Last 3 Encounters:  11/07/15 157 lb 8 oz (71.442 kg)  11/03/14 151 lb 4 oz (68.607 kg)  11/01/13 149 lb (67.586 kg)   BP Readings from Last 3 Encounters:  11/07/15 128/82  11/03/14 116/81  11/01/13 104/80    Past Medical History  Diagnosis Date  . Chicken pox   . Hypertension   . Hyperlipidemia    Family History  Problem Relation Age of Onset  . Alcohol abuse Sister   . Drug abuse Sister   . Cancer Sister 13    ovarian   . Mental illness Sister   . Cancer Maternal Aunt     breast  . Breast cancer Maternal Aunt 60  . Hyperlipidemia Maternal Grandmother   . Heart disease Maternal Grandmother   . Hypertension Maternal Grandmother   . Hyperlipidemia Maternal Grandfather   . Heart disease Maternal Grandfather   . Hypertension Maternal Grandfather   . Cancer Cousin     breast  . Breast cancer Cousin 17  . Hypertension Father   . Hyperlipidemia Mother    Past Surgical History  Procedure Laterality Date  . Ablation  2004  . Novasure ablation    . Cervical biopsy  w/ loop electrode excision  2015   Social History   Social History  . Marital Status: Married    Spouse Name: N/A  . Number of Children: N/A  . Years of Education: N/A   Social History Main Topics  . Smoking status: Former Smoker -- 1.00 packs/day for 10 years    Types: Cigarettes  . Smokeless tobacco: None  . Alcohol Use: No  . Drug Use: No  . Sexual Activity: Not Asked   Other Topics Concern  . None   Social History Narrative   Lives in [place] with husband and 2 daughters. 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  Eyes: Negative for visual disturbance.  Respiratory: Negative for shortness of breath.   Cardiovascular: Negative for chest pain and leg swelling.  Gastrointestinal: Negative for nausea, vomiting, abdominal pain, diarrhea and constipation.  Musculoskeletal: Negative for myalgias and arthralgias.  Skin: Negative for color change and rash.  Hematological: Negative for adenopathy. Does not bruise/bleed easily.  Psychiatric/Behavioral: Negative for sleep disturbance and dysphoric mood. The patient is not nervous/anxious.        Objective:    BP 128/82 mmHg  Pulse 71  Temp(Src) 98.1 F (36.7 C) (Oral)  Ht 5' 5.5" (1.664 m)  Wt 157 lb 8 oz (71.442 kg)  BMI 25.80 kg/m2  SpO2 98% Physical Exam  Constitutional: She is oriented to person, place, and time. She appears well-developed and well-nourished. No distress.  HENT:  Head: Normocephalic and atraumatic.  Right Ear: External ear normal.  Left Ear: External ear normal.  Nose: Nose normal.  Mouth/Throat: Oropharynx is clear and moist. No oropharyngeal exudate.  Eyes: Conjunctivae and EOM are normal. Pupils are equal, round, and reactive to light. Right eye exhibits no discharge. Left eye  exhibits no discharge. No scleral icterus.  Neck: Normal range of motion. Neck supple. No tracheal deviation present. No thyromegaly present.  Cardiovascular: Normal rate, regular rhythm, normal heart sounds and intact distal pulses.  Exam reveals no gallop and no friction rub.   No murmur heard. Pulmonary/Chest: Effort normal and breath sounds normal. No respiratory distress. She has no wheezes. She has no rales. She exhibits no tenderness.  Abdominal: Soft. Bowel sounds are normal. She exhibits no distension and no mass. There is no tenderness. There is no rebound and no guarding.  Musculoskeletal: Normal range of motion. She exhibits no edema or tenderness.  Lymphadenopathy:    She  has no cervical adenopathy.  Neurological: She is alert and oriented to person, place, and time. No cranial nerve deficit. She exhibits normal muscle tone. Coordination normal.  Skin: Skin is warm and dry. No rash noted. She is not diaphoretic. No erythema. No pallor.  Psychiatric: She has a normal mood and affect. Her behavior is normal. Judgment and thought content normal.          Assessment & Plan:   Problem List Items Addressed This Visit      Unprioritized   Routine general medical examination at health care facility - Primary    General medical exam normal today. Breast and pelvic exam deferred as completed by her OB. Reviewed last PAP in 07/2015 which was normal, HPV neg. Labs today. Mammogram UTD and reviewed. Encouraged healthy diet and exercise. Follow up in 6 months and prn.      Relevant Orders   TSH   CBC with Differential/Platelet   Comprehensive metabolic panel   Lipid panel   VITAMIN D 25 Hydroxy (Vit-D Deficiency, Fractures)       Return in about 6 months (around 05/06/2016) for Recheck.

## 2015-11-07 NOTE — Assessment & Plan Note (Signed)
General medical exam normal today. Breast and pelvic exam deferred as completed by her OB. Reviewed last PAP in 07/2015 which was normal, HPV neg. Labs today. Mammogram UTD and reviewed. Encouraged healthy diet and exercise. Follow up in 6 months and prn.

## 2015-11-07 NOTE — Patient Instructions (Signed)
Health Maintenance, Female Adopting a healthy lifestyle and getting preventive care can go a long way to promote health and wellness. Talk with your health care provider about what schedule of regular examinations is right for you. This is a good chance for you to check in with your provider about disease prevention and staying healthy. In between checkups, there are plenty of things you can do on your own. Experts have done a lot of research about which lifestyle changes and preventive measures are most likely to keep you healthy. Ask your health care provider for more information. WEIGHT AND DIET  Eat a healthy diet  Be sure to include plenty of vegetables, fruits, low-fat dairy products, and lean protein.  Do not eat a lot of foods high in solid fats, added sugars, or salt.  Get regular exercise. This is one of the most important things you can do for your health.  Most adults should exercise for at least 150 minutes each week. The exercise should increase your heart rate and make you sweat (moderate-intensity exercise).  Most adults should also do strengthening exercises at least twice a week. This is in addition to the moderate-intensity exercise.  Maintain a healthy weight  Body mass index (BMI) is a measurement that can be used to identify possible weight problems. It estimates body fat based on height and weight. Your health care provider can help determine your BMI and help you achieve or maintain a healthy weight.  For females 20 years of age and older:   A BMI below 18.5 is considered underweight.  A BMI of 18.5 to 24.9 is normal.  A BMI of 25 to 29.9 is considered overweight.  A BMI of 30 and above is considered obese.  Watch levels of cholesterol and blood lipids  You should start having your blood tested for lipids and cholesterol at 55 years of age, then have this test every 5 years.  You may need to have your cholesterol levels checked more often if:  Your lipid  or cholesterol levels are high.  You are older than 55 years of age.  You are at high risk for heart disease.  CANCER SCREENING   Lung Cancer  Lung cancer screening is recommended for adults 55-80 years old who are at high risk for lung cancer because of a history of smoking.  A yearly low-dose CT scan of the lungs is recommended for people who:  Currently smoke.  Have quit within the past 15 years.  Have at least a 30-pack-year history of smoking. A pack year is smoking an average of one pack of cigarettes a day for 1 year.  Yearly screening should continue until it has been 15 years since you quit.  Yearly screening should stop if you develop a health problem that would prevent you from having lung cancer treatment.  Breast Cancer  Practice breast self-awareness. This means understanding how your breasts normally appear and feel.  It also means doing regular breast self-exams. Let your health care provider know about any changes, no matter how small.  If you are in your 20s or 30s, you should have a clinical breast exam (CBE) by a health care provider every 1-3 years as part of a regular health exam.  If you are 40 or older, have a CBE every year. Also consider having a breast X-ray (mammogram) every year.  If you have a family history of breast cancer, talk to your health care provider about genetic screening.  If you   are at high risk for breast cancer, talk to your health care provider about having an MRI and a mammogram every year.  Breast cancer gene (BRCA) assessment is recommended for women who have family members with BRCA-related cancers. BRCA-related cancers include:  Breast.  Ovarian.  Tubal.  Peritoneal cancers.  Results of the assessment will determine the need for genetic counseling and BRCA1 and BRCA2 testing. Cervical Cancer Your health care provider may recommend that you be screened regularly for cancer of the pelvic organs (ovaries, uterus, and  vagina). This screening involves a pelvic examination, including checking for microscopic changes to the surface of your cervix (Pap test). You may be encouraged to have this screening done every 3 years, beginning at age 21.  For women ages 30-65, health care providers may recommend pelvic exams and Pap testing every 3 years, or they may recommend the Pap and pelvic exam, combined with testing for human papilloma virus (HPV), every 5 years. Some types of HPV increase your risk of cervical cancer. Testing for HPV may also be done on women of any age with unclear Pap test results.  Other health care providers may not recommend any screening for nonpregnant women who are considered low risk for pelvic cancer and who do not have symptoms. Ask your health care provider if a screening pelvic exam is right for you.  If you have had past treatment for cervical cancer or a condition that could lead to cancer, you need Pap tests and screening for cancer for at least 20 years after your treatment. If Pap tests have been discontinued, your risk factors (such as having a new sexual partner) need to be reassessed to determine if screening should resume. Some women have medical problems that increase the chance of getting cervical cancer. In these cases, your health care provider may recommend more frequent screening and Pap tests. Colorectal Cancer  This type of cancer can be detected and often prevented.  Routine colorectal cancer screening usually begins at 55 years of age and continues through 55 years of age.  Your health care provider may recommend screening at an earlier age if you have risk factors for colon cancer.  Your health care provider may also recommend using home test kits to check for hidden blood in the stool.  A small camera at the end of a tube can be used to examine your colon directly (sigmoidoscopy or colonoscopy). This is done to check for the earliest forms of colorectal  cancer.  Routine screening usually begins at age 50.  Direct examination of the colon should be repeated every 5-10 years through 55 years of age. However, you may need to be screened more often if early forms of precancerous polyps or small growths are found. Skin Cancer  Check your skin from head to toe regularly.  Tell your health care provider about any new moles or changes in moles, especially if there is a change in a mole's shape or color.  Also tell your health care provider if you have a mole that is larger than the size of a pencil eraser.  Always use sunscreen. Apply sunscreen liberally and repeatedly throughout the day.  Protect yourself by wearing long sleeves, pants, a wide-brimmed hat, and sunglasses whenever you are outside. HEART DISEASE, DIABETES, AND HIGH BLOOD PRESSURE   High blood pressure causes heart disease and increases the risk of stroke. High blood pressure is more likely to develop in:  People who have blood pressure in the high end   of the normal range (130-139/85-89 mm Hg).  People who are overweight or obese.  People who are African American.  If you are 38-23 years of age, have your blood pressure checked every 3-5 years. If you are 61 years of age or older, have your blood pressure checked every year. You should have your blood pressure measured twice--once when you are at a hospital or clinic, and once when you are not at a hospital or clinic. Record the average of the two measurements. To check your blood pressure when you are not at a hospital or clinic, you can use:  An automated blood pressure machine at a pharmacy.  A home blood pressure monitor.  If you are between 45 years and 39 years old, ask your health care provider if you should take aspirin to prevent strokes.  Have regular diabetes screenings. This involves taking a blood sample to check your fasting blood sugar level.  If you are at a normal weight and have a low risk for diabetes,  have this test once every three years after 55 years of age.  If you are overweight and have a high risk for diabetes, consider being tested at a younger age or more often. PREVENTING INFECTION  Hepatitis B  If you have a higher risk for hepatitis B, you should be screened for this virus. You are considered at high risk for hepatitis B if:  You were born in a country where hepatitis B is common. Ask your health care provider which countries are considered high risk.  Your parents were born in a high-risk country, and you have not been immunized against hepatitis B (hepatitis B vaccine).  You have HIV or AIDS.  You use needles to inject street drugs.  You live with someone who has hepatitis B.  You have had sex with someone who has hepatitis B.  You get hemodialysis treatment.  You take certain medicines for conditions, including cancer, organ transplantation, and autoimmune conditions. Hepatitis C  Blood testing is recommended for:  Everyone born from [birth year] through 1965.  Anyone with known risk factors for hepatitis C. Sexually transmitted infections (STIs)  You should be screened for sexually transmitted infections (STIs) including gonorrhea and chlamydia if:  You are sexually active and are younger than 55 years of age.  You are older than 55 years of age and your health care provider tells you that you are at risk for this type of infection.  Your sexual activity has changed since you were last screened and you are at an increased risk for chlamydia or gonorrhea. Ask your health care provider if you are at risk.  If you do not have HIV, but are at risk, it may be recommended that you take a prescription medicine daily to prevent HIV infection. This is called pre-exposure prophylaxis (PrEP). You are considered at risk if:  You are sexually active and do not regularly use condoms or know the HIV status of your partner(s).  You take drugs by injection.  You are sexually  active with a partner who has HIV. Talk with your health care provider about whether you are at high risk of being infected with HIV. If you choose to begin PrEP, you should first be tested for HIV. You should then be tested every 3 months for as long as you are taking PrEP.  PREGNANCY   If you are premenopausal and you may become pregnant, ask your health care provider about preconception counseling.  If you may  become pregnant, take 400 to 800 micrograms (mcg) of folic acid every day.  If you want to prevent pregnancy, talk to your health care provider about birth control (contraception). OSTEOPOROSIS AND MENOPAUSE   Osteoporosis is a disease in which the bones lose minerals and strength with aging. This can result in serious bone fractures. Your risk for osteoporosis can be identified using a bone density scan.  If you are 61 years of age or older, or if you are at risk for osteoporosis and fractures, ask your health care provider if you should be screened.  Ask your health care provider whether you should take a calcium or vitamin D supplement to lower your risk for osteoporosis.  Menopause may have certain physical symptoms and risks.  Hormone replacement therapy may reduce some of these symptoms and risks. Talk to your health care provider about whether hormone replacement therapy is right for you.  HOME CARE INSTRUCTIONS   Schedule regular health, dental, and eye exams.  Stay current with your immunizations.   Do not use any tobacco products including cigarettes, chewing tobacco, or electronic cigarettes.  If you are pregnant, do not drink alcohol.  If you are breastfeeding, limit how much and how often you drink alcohol.  Limit alcohol intake to no more than 1 drink per day for nonpregnant women. One drink equals 12 ounces of beer, 5 ounces of wine, or 1 ounces of hard liquor.  Do not use street drugs.  Do not share needles.  Ask your health care provider for help if  you need support or information about quitting drugs.  Tell your health care provider if you often feel depressed.  Tell your health care provider if you have ever been abused or do not feel safe at home.   This information is not intended to replace advice given to you by your health care provider. Make sure you discuss any questions you have with your health care provider.   Document Released: 04/22/2011 Document Revised: 10/28/2014 Document Reviewed: 09/08/2013 Elsevier Interactive Patient Education [employer].

## 2015-12-31 ENCOUNTER — Other Ambulatory Visit: Payer: Self-pay | Admitting: Internal Medicine

## 2016-01-14 ENCOUNTER — Encounter: Payer: Self-pay | Admitting: Internal Medicine

## 2016-01-17 ENCOUNTER — Ambulatory Visit (INDEPENDENT_AMBULATORY_CARE_PROVIDER_SITE_OTHER): Payer: BLUE CROSS/BLUE SHIELD | Admitting: Internal Medicine

## 2016-01-17 ENCOUNTER — Telehealth: Payer: Self-pay

## 2016-01-17 ENCOUNTER — Ambulatory Visit (INDEPENDENT_AMBULATORY_CARE_PROVIDER_SITE_OTHER)
Admission: RE | Admit: 2016-01-17 | Discharge: 2016-01-17 | Disposition: A | Discharge status: Home / Self Care | Payer: BLUE CROSS/BLUE SHIELD | Source: Ambulatory Visit | Attending: Internal Medicine | Admitting: Internal Medicine

## 2016-01-17 ENCOUNTER — Other Ambulatory Visit: Payer: Self-pay | Admitting: Internal Medicine

## 2016-01-17 VITALS — BP 110/69 | HR 72 | Temp 98.2°F | Ht 66.0 in | Wt 154.4 lb

## 2016-01-17 DIAGNOSIS — R05 Cough: Secondary | ICD-10-CM | POA: Diagnosis not present

## 2016-01-17 DIAGNOSIS — R059 Cough, unspecified: Secondary | ICD-10-CM | POA: Insufficient documentation

## 2016-01-17 DIAGNOSIS — R9389 Abnormal findings on diagnostic imaging of other specified body structures: Secondary | ICD-10-CM

## 2016-01-17 LAB — CBC WITH DIFFERENTIAL/PLATELET
Basophils Absolute: 0 10*3/uL (ref 0.0–0.1)
Basophils Relative: 0.4 % (ref 0.0–3.0)
Eosinophils Absolute: 0.1 10*3/uL (ref 0.0–0.7)
Eosinophils Relative: 2.1 % (ref 0.0–5.0)
HCT: 40.3 % (ref 36.0–46.0)
Hemoglobin: 13.5 g/dL (ref 12.0–15.0)
Lymphocytes Relative: 33.8 % (ref 12.0–46.0)
Lymphs Abs: 1.7 10*3/uL (ref 0.7–4.0)
MCHC: 33.5 g/dL (ref 30.0–36.0)
MCV: 85.8 fl (ref 78.0–100.0)
Monocytes Absolute: 0.3 10*3/uL (ref 0.1–1.0)
Monocytes Relative: 6.7 % (ref 3.0–12.0)
Neutro Abs: 2.9 10*3/uL (ref 1.4–7.7)
Neutrophils Relative %: 57 % (ref 43.0–77.0)
Platelets: 266 10*3/uL (ref 150.0–400.0)
RBC: 4.7 Mil/uL (ref 3.87–5.11)
RDW: 13.6 % (ref 11.5–15.5)
WBC: 5.1 10*3/uL (ref 4.0–10.5)

## 2016-01-17 MED ORDER — HYDROCOD POLST-CPM POLST ER 10-8 MG/5ML PO SUER: 5.0000 mL | Freq: Two times a day (BID) | ORAL | Status: DC | PRN

## 2016-01-17 MED ORDER — PREDNISONE 10 MG PO TABS: ORAL_TABLET | ORAL | Status: DC

## 2016-01-17 NOTE — Telephone Encounter (Signed)
Noted  

## 2016-01-17 NOTE — Telephone Encounter (Signed)
I called patient and discussed results with her.

## 2016-01-17 NOTE — Progress Notes (Signed)
Pre visit review using our clinic review tool, if applicable. No additional management support is needed unless otherwise documented below in the visit note. 

## 2016-01-17 NOTE — Patient Instructions (Signed)
Start Prednisone taper.  Start Tussionex as needed for cough.  Chest xray today.  Follow up next week.

## 2016-01-17 NOTE — Assessment & Plan Note (Signed)
Persistent cough >4 weeks. Likely viral infection with asthma exacerbation. Will get CXR. Start Prednisone taper. Add Tussionex at night for cough as needed. Continue Tessalon during the day for cough. CBC and Pertussis IGG,IGA today.

## 2016-01-17 NOTE — Telephone Encounter (Signed)
LAB REPORT: Pt's Chest xray results available in EPIC, I can call pt with xray results. Please advise, thanks

## 2016-01-17 NOTE — Progress Notes (Signed)
Subjective:    Patient ID: Andrea Arnold, female    DOB: [birth year], 55 y.o.   MRN: [id number]  HPI  55YO female presents for follow up.  Cough -  Symptoms started 4 weeks ago. Cough, chest tightness, headache. Feeling fatigued. Seen at urgent care 3/18 and treated with Cefdinir and Tessalon. Continues to have headache, dull pain frontal. No fever. Cough is dry. No dyspnea. Chest feels tight.  Wt Readings from Last 3 Encounters:  01/17/16 154 lb 6 oz (70.024 kg)  11/07/15 157 lb 8 oz (71.442 kg)  11/03/14 151 lb 4 oz (68.607 kg)   BP Readings from Last 3 Encounters:  01/17/16 110/69  11/07/15 128/82  11/03/14 116/81    Past Medical History  Diagnosis Date  . Chicken pox   . Hypertension   . Hyperlipidemia    Family History  Problem Relation Age of Onset  . Alcohol abuse Sister   . Drug abuse Sister   . Cancer Sister 13    ovarian   . Mental illness Sister   . Cancer Maternal Aunt     breast  . Breast cancer Maternal Aunt 60  . Hyperlipidemia Maternal Grandmother   . Heart disease Maternal Grandmother   . Hypertension Maternal Grandmother   . Hyperlipidemia Maternal Grandfather   . Heart disease Maternal Grandfather   . Hypertension Maternal Grandfather   . Cancer Cousin     breast  . Breast cancer Cousin 15  . Hypertension Father   . Hyperlipidemia Mother    Past Surgical History  Procedure Laterality Date  . Ablation  2004  . Novasure ablation    . Cervical biopsy  w/ loop electrode excision  2015   Social History   Social History  . Marital Status: Married    Spouse Name: N/A  . Number of Children: N/A  . Years of Education: N/A   Social History Main Topics  . Smoking status: Former Smoker -- 1.00 packs/day for 10 years    Types: Cigarettes  . Smokeless tobacco: Not on file  . Alcohol Use: No  . Drug Use: No  . Sexual Activity: Not on file   Other Topics Concern  . Not on file   Social History Narrative   Lives in [place]  with husband and 2 daughters. 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 Negative for fever, chills and unexpected weight change.  HENT: Negative for congestion, ear discharge, ear pain, facial swelling, hearing loss, mouth sores, nosebleeds, postnasal drip, rhinorrhea, sinus pressure, sneezing, sore throat, tinnitus, trouble swallowing and voice change.   Eyes: Negative for pain, discharge, redness and visual disturbance.  Respiratory: Positive for cough and chest tightness. Negative for shortness of breath, wheezing and stridor.   Cardiovascular: Negative for chest pain, palpitations and leg swelling.  Gastrointestinal: Negative for nausea, vomiting, diarrhea and constipation.  Musculoskeletal: Negative for myalgias, arthralgias, neck pain and neck stiffness.  Skin: Negative for color change and rash.  Neurological: Negative for dizziness, weakness, light-headedness and headaches.  Hematological: Negative for adenopathy.  Psychiatric/Behavioral: Positive for sleep disturbance.       Objective:    BP 110/69 mmHg  Pulse 72  Temp(Src) 98.2 F (36.8 C) (Oral)  Ht 5\' 6"  (1.676 m)  Wt 154 lb 6 oz (70.024 kg)  BMI  24.93 kg/m2  SpO2 99% Physical Exam  Constitutional: She is oriented to person, place, and time. She appears well-developed and well-nourished. No distress.  HENT:  Head: Normocephalic and atraumatic.  Right Ear: External ear normal.  Left Ear: External ear normal.  Nose: Nose normal.  Mouth/Throat: Oropharynx is clear and moist. No oropharyngeal exudate.  Eyes: Conjunctivae are normal. Pupils are equal, round, and reactive to light. Right eye exhibits no discharge. Left eye exhibits no discharge. No scleral icterus.  Neck: Normal range of motion. Neck supple. No tracheal deviation present. No thyromegaly present.  Cardiovascular: Normal  rate, regular rhythm, normal heart sounds and intact distal pulses.  Exam reveals no gallop and no friction rub.   No murmur heard. Pulmonary/Chest: Effort normal. No accessory muscle usage. No tachypnea. No respiratory distress. She has decreased breath sounds (prolonged expiration). She has no wheezes. She has no rhonchi. She has no rales. She exhibits no tenderness.  Musculoskeletal: Normal range of motion. She exhibits no edema or tenderness.  Lymphadenopathy:    She has no cervical adenopathy.  Neurological: She is alert and oriented to person, place, and time. No cranial nerve deficit. She exhibits normal muscle tone. Coordination normal.  Skin: Skin is warm and dry. No rash noted. She is not diaphoretic. No erythema. No pallor.  Psychiatric: She has a normal mood and affect. Her behavior is normal. Judgment and thought content normal.          Assessment & Plan:   Problem List Items Addressed This Visit      Unprioritized   Cough - Primary    Persistent cough >4 weeks. Likely viral infection with asthma exacerbation. Will get CXR. Start Prednisone taper. Add Tussionex at night for cough as needed. Continue Tessalon during the day for cough. CBC and Pertussis IGG,IGA today.      Relevant Medications   predniSONE (DELTASONE) 10 MG tablet   Other Relevant Orders   CBC w/Diff   DG Chest 2 View   Bordetella pertussis Ab IgG, IgA       Return in about 1 week (around 01/24/2016) for Recheck.  Ronette Deter, MD Internal Medicine [facility]

## 2016-01-22 ENCOUNTER — Ambulatory Visit
Admission: RE | Admit: 2016-01-22 | Discharge: 2016-01-22 | Disposition: A | Discharge status: Home / Self Care | Payer: BLUE CROSS/BLUE SHIELD | Source: Ambulatory Visit | Attending: Internal Medicine | Admitting: Internal Medicine

## 2016-01-22 DIAGNOSIS — R938 Abnormal findings on diagnostic imaging of other specified body structures: Secondary | ICD-10-CM | POA: Diagnosis present

## 2016-01-22 DIAGNOSIS — R911 Solitary pulmonary nodule: Secondary | ICD-10-CM | POA: Insufficient documentation

## 2016-01-22 DIAGNOSIS — R9389 Abnormal findings on diagnostic imaging of other specified body structures: Secondary | ICD-10-CM

## 2016-01-25 ENCOUNTER — Ambulatory Visit (INDEPENDENT_AMBULATORY_CARE_PROVIDER_SITE_OTHER): Payer: BLUE CROSS/BLUE SHIELD | Admitting: Internal Medicine

## 2016-01-25 ENCOUNTER — Encounter: Payer: Self-pay | Admitting: Internal Medicine

## 2016-01-25 VITALS — BP 132/81 | HR 70 | Temp 98.1°F | Ht 66.0 in | Wt 155.1 lb

## 2016-01-25 DIAGNOSIS — R059 Cough, unspecified: Secondary | ICD-10-CM

## 2016-01-25 DIAGNOSIS — R9389 Abnormal findings on diagnostic imaging of other specified body structures: Secondary | ICD-10-CM | POA: Insufficient documentation

## 2016-01-25 DIAGNOSIS — R05 Cough: Secondary | ICD-10-CM | POA: Diagnosis not present

## 2016-01-25 DIAGNOSIS — R5382 Chronic fatigue, unspecified: Secondary | ICD-10-CM | POA: Diagnosis not present

## 2016-01-25 DIAGNOSIS — R938 Abnormal findings on diagnostic imaging of other specified body structures: Secondary | ICD-10-CM

## 2016-01-25 LAB — VITAMIN D 25 HYDROXY (VIT D DEFICIENCY, FRACTURES): VITD: 21.05 ng/mL — ABNORMAL LOW (ref 30.00–100.00)

## 2016-01-25 LAB — COMPREHENSIVE METABOLIC PANEL
ALBUMIN: 4.2 g/dL (ref 3.5–5.2)
ALT: 12 U/L (ref 0–35)
AST: 10 U/L (ref 0–37)
Alkaline Phosphatase: 40 U/L (ref 39–117)
BILIRUBIN TOTAL: 0.5 mg/dL (ref 0.2–1.2)
BUN: 24 mg/dL — ABNORMAL HIGH (ref 6–23)
CALCIUM: 9.7 mg/dL (ref 8.4–10.5)
CO2: 30 meq/L (ref 19–32)
CREATININE: 1.08 mg/dL (ref 0.40–1.20)
Chloride: 101 mEq/L (ref 96–112)
GFR: 56.12 mL/min — ABNORMAL LOW (ref >60.00)
Glucose, Bld: 89 mg/dL (ref 70–99)
Potassium: 4.3 mEq/L (ref 3.5–5.1)
Sodium: 137 mEq/L (ref 135–145)
Total Protein: 6.3 g/dL (ref 6.0–8.3)

## 2016-01-25 LAB — TSH: TSH: 0.98 u[IU]/mL (ref 0.35–4.50)

## 2016-01-25 LAB — VITAMIN B12: Vitamin B-12: 409 pg/mL (ref 211–911)

## 2016-01-25 MED ORDER — OLOPATADINE HCL 0.2 % OP SOLN: 1.0000 [drp] | Freq: Every day | OPHTHALMIC | Status: DC

## 2016-01-25 NOTE — Assessment & Plan Note (Signed)
Cough improving some. Exam normal today. Testing for Pertussis pending. Will start Claritin and Benadryl to help with allergy symptoms which are likely contributing.

## 2016-01-25 NOTE — Patient Instructions (Addendum)
Try starting Claritin 10mg  in the morning.  Take Benadryl 25mg  at bedtime.  Start Flonase or Nasacort daily.  Start Pataday.

## 2016-01-25 NOTE — Progress Notes (Signed)
Subjective:    Patient ID: Andrea Arnold, female    DOB: 1960-12-30, 55 y.o.   MRN: [id number]  HPI  55YO female presents for followup.  Recently seen for cough. CBC was normal. Testing for Pertussis still pending. CXR showed possible right lung nodule. CT scan showed 64mm nodule RLL.  Continues to feel extremely fatigued. Working 12hr days. Notes nasal congestion, sneezing, watery eyes. Feels that allergies are contributing. No fever, chills. Sometimes snores at night, noted by her husband.  No previous diagnosis of OSA.  Wt Readings from Last 3 Encounters:  01/25/16 155 lb 2 oz (70.364 kg)  01/17/16 154 lb 6 oz (70.024 kg)  11/07/15 157 lb 8 oz (71.442 kg)   BP Readings from Last 3 Encounters:  01/25/16 132/81  01/17/16 110/69  11/07/15 128/82    Past Medical History  Diagnosis Date  . Chicken pox   . Hypertension   . Hyperlipidemia    Family History  Problem Relation Age of Onset  . Alcohol abuse Sister   . Drug abuse Sister   . Cancer Sister 13    ovarian   . Mental illness Sister   . Cancer Maternal Aunt     breast  . Breast cancer Maternal Aunt 60  . Hyperlipidemia Maternal Grandmother   . Heart disease Maternal Grandmother   . Hypertension Maternal Grandmother   . Hyperlipidemia Maternal Grandfather   . Heart disease Maternal Grandfather   . Hypertension Maternal Grandfather   . Cancer Cousin     breast  . Breast cancer Cousin 85  . Hypertension Father   . Hyperlipidemia Mother    Past Surgical History  Procedure Laterality Date  . Ablation  2004  . Novasure ablation    . Cervical biopsy  w/ loop electrode excision  2015   Social History   Social History  . Marital Status: Married    Spouse Name: N/A  . Number of Children: N/A  . Years of Education: N/A   Social History Main Topics  . Smoking status: Former Smoker -- 1.00 packs/day for 10 years    Types: Cigarettes  . Smokeless tobacco: None  . Alcohol Use: No  . Drug Use: No  .  Sexual Activity: Not Asked   Other Topics Concern  . None   Social History Narrative   Lives in [place] with husband and 2 daughters. 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 Negative for fever, chills, appetite change and unexpected weight change.  HENT: Positive for postnasal drip, rhinorrhea and sneezing. Negative for ear discharge, ear pain, facial swelling, hearing loss, mouth sores, nosebleeds, sinus pressure, sore throat, tinnitus, trouble swallowing and voice change.   Eyes: Positive for discharge. Negative for pain, redness and visual disturbance.  Respiratory: Positive for cough. Negative for chest tightness, shortness of breath, wheezing and stridor.   Cardiovascular: Negative for chest pain, palpitations and leg swelling.  Gastrointestinal: Negative for abdominal pain.  Musculoskeletal: Negative for myalgias, arthralgias, neck pain and neck stiffness.  Skin: Negative for color change and rash.  Neurological: Negative for dizziness, weakness, light-headedness and headaches.  Hematological: Negative for adenopathy. Does not bruise/bleed easily.  Psychiatric/Behavioral: Negative for dysphoric mood. The patient is not nervous/anxious.        Objective:    BP 132/81 mmHg  Pulse 70  Temp(Src) 98.1 F (36.7 C) (Oral)  Ht 5\' 6"  (1.676 m)  Wt 155 lb 2 oz (70.364 kg)  BMI 25.05 kg/m2  SpO2 98% Physical Exam  Constitutional: She is oriented to person, place, and time. She appears well-developed and well-nourished. No distress.  HENT:  Head: Normocephalic and atraumatic.  Right Ear: External ear normal.  Left Ear: External ear normal.  Nose: Nose normal.  Mouth/Throat: Oropharynx is clear and moist. No oropharyngeal exudate.  Eyes: Conjunctivae are normal. Pupils are equal, round, and reactive to light. Right eye exhibits no  discharge. Left eye exhibits no discharge. No scleral icterus.  Neck: Normal range of motion. Neck supple. No tracheal deviation present. No thyromegaly present.  Cardiovascular: Normal rate, regular rhythm, normal heart sounds and intact distal pulses.  Exam reveals no gallop and no friction rub.   No murmur heard. Pulmonary/Chest: Effort normal and breath sounds normal. No respiratory distress. She has no wheezes. She has no rales. She exhibits no tenderness.  Musculoskeletal: Normal range of motion. She exhibits no edema or tenderness.  Lymphadenopathy:    She has no cervical adenopathy.  Neurological: She is alert and oriented to person, place, and time. No cranial nerve deficit. She exhibits normal muscle tone. Coordination normal.  Skin: Skin is warm and dry. No rash noted. She is not diaphoretic. No erythema. No pallor.  Psychiatric: She has a normal mood and affect. Her behavior is normal. Judgment and thought content normal.          Assessment & Plan:   Problem List Items Addressed This Visit      Unprioritized   Abnormal CXR   Chronic fatigue    Recent fatigue likely related to allergy symptoms and disrupted sleep. Starting Claritin, Benadryl, Flonase, and Pataday. Discussed testing for OSA if symptoms not improving. Will also check B12, Vit D and TSH today.      Relevant Orders   Vitamin D (25 hydroxy)   B12   TSH   Comprehensive metabolic panel   Cough - Primary    Cough improving some. Exam normal today. Testing for Pertussis pending. Will start Claritin and Benadryl to help with allergy symptoms which are likely contributing.          Return in about 4 weeks (around 02/22/2016) for Recheck.  Ronette Deter, MD Internal Medicine [facility]

## 2016-01-25 NOTE — Assessment & Plan Note (Addendum)
Recent fatigue likely related to allergy symptoms and disrupted sleep. Starting Claritin, Benadryl, Flonase, and Pataday. Discussed testing for OSA if symptoms not improving. Will also check B12, Vit D and TSH today.

## 2016-01-25 NOTE — Progress Notes (Signed)
Pre visit review using our clinic review tool, if applicable. No additional management support is needed unless otherwise documented below in the visit note. 

## 2016-01-29 LAB — BORDETELLA PERTUSSIS AB IGG,IGA
FHA IGG: 31 [IU]/mL
FHA IgA: 3 IU/mL
PT IGA: 1 [IU]/mL
PT IGG: 18 [IU]/mL

## 2016-03-01 ENCOUNTER — Ambulatory Visit: Payer: BLUE CROSS/BLUE SHIELD | Admitting: Internal Medicine

## 2016-03-29 ENCOUNTER — Encounter: Payer: Self-pay | Admitting: Internal Medicine

## 2016-05-14 ENCOUNTER — Encounter: Payer: Self-pay | Admitting: Internal Medicine

## 2016-07-30 ENCOUNTER — Other Ambulatory Visit: Payer: Self-pay | Admitting: Obstetrics and Gynecology

## 2016-07-30 DIAGNOSIS — Z1231 Encounter for screening mammogram for malignant neoplasm of breast: Secondary | ICD-10-CM

## 2016-08-23 ENCOUNTER — Ambulatory Visit
Admission: RE | Admit: 2016-08-23 | Discharge: 2016-08-23 | Disposition: A | Discharge status: Home / Self Care | Payer: BLUE CROSS/BLUE SHIELD | Source: Ambulatory Visit | Attending: Obstetrics and Gynecology | Admitting: Obstetrics and Gynecology

## 2016-08-23 DIAGNOSIS — Z1231 Encounter for screening mammogram for malignant neoplasm of breast: Secondary | ICD-10-CM

## 2016-12-27 DIAGNOSIS — E559 Vitamin D deficiency, unspecified: Secondary | ICD-10-CM | POA: Insufficient documentation

## 2016-12-27 DIAGNOSIS — E7800 Pure hypercholesterolemia, unspecified: Secondary | ICD-10-CM | POA: Insufficient documentation

## 2016-12-31 DIAGNOSIS — R300 Dysuria: Secondary | ICD-10-CM | POA: Insufficient documentation

## 2017-01-06 DIAGNOSIS — R3129 Other microscopic hematuria: Secondary | ICD-10-CM | POA: Insufficient documentation

## 2017-01-07 ENCOUNTER — Other Ambulatory Visit: Payer: Self-pay | Admitting: Internal Medicine

## 2017-01-07 ENCOUNTER — Other Ambulatory Visit: Payer: Self-pay

## 2017-01-07 DIAGNOSIS — R3129 Other microscopic hematuria: Secondary | ICD-10-CM

## 2017-01-14 ENCOUNTER — Ambulatory Visit: Payer: BLUE CROSS/BLUE SHIELD

## 2017-01-21 ENCOUNTER — Ambulatory Visit: Payer: Self-pay

## 2017-01-22 ENCOUNTER — Encounter: Payer: Self-pay | Admitting: Urology

## 2017-01-22 ENCOUNTER — Ambulatory Visit: Payer: BLUE CROSS/BLUE SHIELD | Admitting: Urology

## 2017-01-22 VITALS — BP 164/85 | HR 90 | Ht 65.0 in | Wt 151.0 lb

## 2017-01-22 DIAGNOSIS — R3129 Other microscopic hematuria: Secondary | ICD-10-CM | POA: Diagnosis not present

## 2017-01-22 DIAGNOSIS — IMO0002 Reserved for concepts with insufficient information to code with codable children: Secondary | ICD-10-CM | POA: Insufficient documentation

## 2017-01-22 LAB — MICROSCOPIC EXAMINATION
BACTERIA UA: NONE SEEN
EPITHELIAL CELLS (NON RENAL): NONE SEEN /HPF (ref 0–10)
WBC UA: NONE SEEN /HPF (ref 0–?)

## 2017-01-22 LAB — URINALYSIS, COMPLETE
BILIRUBIN UA: NEGATIVE
Glucose, UA: NEGATIVE
Ketones, UA: NEGATIVE
LEUKOCYTES UA: NEGATIVE
Nitrite, UA: NEGATIVE
PH UA: 7 (ref 5.0–7.5)
Protein, UA: NEGATIVE
Specific Gravity, UA: 1.01 (ref 1.005–1.030)
Urobilinogen, Ur: 0.2 mg/dL (ref 0.2–1.0)

## 2017-01-22 NOTE — Progress Notes (Signed)
01/22/2017 8:46 AM   Andrea Arnold 1960-11-12 [id number]  Referring provider: Glendon Axe, MD [facility] [place], [place] [address]  Chief Complaint  Patient presents with  . New Patient (Initial Visit)    hematuria    HPI: The patient is a 56 year old female presents for evaluation of microscopic hematuria. She has no history of gross hematuria. She denies frequent urinary tract infections. She has no urinary symptoms at this time. She has no history of nephrolithiasis. She voids well. She is a former smoker with a 1 pack per day for 10 year history.    PMH: Past Medical History:  Diagnosis Date  . Chicken pox   . Hyperlipidemia   . Hypertension     Surgical History: Past Surgical History:  Procedure Laterality Date  . ABLATION  2004  . CERVICAL BIOPSY  W/ LOOP ELECTRODE EXCISION  2015  . NOVASURE ABLATION      Home Medications:  Allergies as of 01/22/2017   No Known Allergies     Medication List       Accurate as of 01/22/17  8:46 AM. Always use your most recent med list.          atorvastatin 10 MG tablet Commonly known as:  LIPITOR Take 1 tablet (10 mg total) by mouth daily.   multivitamin tablet Take 1 tablet by mouth daily.   Olopatadine HCl 0.2 % Soln Apply 1 drop to eye daily.   PROAIR HFA 108 (90 Base) MCG/ACT inhaler Generic drug:  albuterol INHALE 2 PUFF BY INHALATION ROUTE EVERY 4 - 6 HOURS AS NEEDED       Allergies: No Known Allergies  Family History: Family History  Problem Relation Age of Onset  . Alcohol abuse Sister   . Drug abuse Sister   . Cancer Sister 13    ovarian   . Mental illness Sister   . Hypertension Father   . Hyperlipidemia Mother   . Cancer Maternal Aunt     breast  . Breast cancer Maternal Aunt 60  . Hyperlipidemia Maternal Grandmother   . Heart disease Maternal Grandmother   . Hypertension Maternal Grandmother   . Hyperlipidemia Maternal Grandfather   . Heart disease  Maternal Grandfather   . Hypertension Maternal Grandfather   . Cancer Cousin     breast  . Breast cancer Cousin 74    Social History:  reports that she has quit smoking. Her smoking use included Cigarettes. She has a 10.00 pack-year smoking history. She has never used smokeless tobacco. She reports that she does not drink alcohol or use drugs.  ROS: UROLOGY Frequent Urination?: No Hard to postpone urination?: No Burning/pain with urination?: No Get up at night to urinate?: No Leakage of urine?: No Urine stream starts and stops?: No Trouble starting stream?: No Do you have to strain to urinate?: No Blood in urine?: Yes Urinary tract infection?: No Sexually transmitted disease?: No Injury to kidneys or bladder?: No Painful intercourse?: No Weak stream?: No Currently pregnant?: No Vaginal bleeding?: No Last menstrual period?: n  Gastrointestinal Nausea?: No Vomiting?: No Indigestion/heartburn?: No Diarrhea?: No Constipation?: No  Constitutional Fever: No Night sweats?: No Weight loss?: No Fatigue?: No  Skin Skin rash/lesions?: No Itching?: Yes  Eyes Blurred vision?: No Double vision?: No  Ears/Nose/Throat Sore throat?: No Sinus problems?: No  Hematologic/Lymphatic Swollen glands?: No Easy bruising?: No  Cardiovascular Leg swelling?: No Chest pain?: No  Respiratory Cough?: No Shortness of breath?: No  Endocrine Excessive thirst?: No  Musculoskeletal Back pain?: No Joint pain?: No  Neurological Headaches?: No Dizziness?: No  Psychologic Depression?: No Anxiety?: No  Physical Exam: BP (!) 164/85   Pulse 90   Ht 5\' 5"  (1.651 m)   Wt 151 lb (68.5 kg)   BMI 25.13 kg/m   Constitutional:  Alert and oriented, No acute distress. HEENT: [place] AT, moist mucus membranes.  Trachea midline, no masses. Cardiovascular: No clubbing, cyanosis, or edema. Respiratory: Normal respiratory effort, no increased work of breathing. GI: Abdomen is soft,  nontender, nondistended, no abdominal masses GU: No CVA tenderness.  Skin: No rashes, bruises or suspicious lesions. Lymph: No cervical or inguinal adenopathy. Neurologic: Grossly intact, no focal deficits, moving all 4 extremities. Psychiatric: Normal mood and affect.  Laboratory Data: Lab Results  Component Value Date   WBC 5.1 01/17/2016   HGB 13.5 01/17/2016   HCT 40.3 01/17/2016   MCV 85.8 01/17/2016   PLT 266.0 01/17/2016    Lab Results  Component Value Date   CREATININE 1.08 01/25/2016    No results found for: PSA  No results found for: TESTOSTERONE  No results found for: HGBA1C  Urinalysis No results found for: COLORURINE, APPEARANCEUR, LABSPEC, PHURINE, GLUCOSEU, HGBUR, BILIRUBINUR, KETONESUR, PROTEINUR, UROBILINOGEN, NITRITE, LEUKOCYTESUR    Assessment & Plan:    1. Microscopic hematuria -CT Urogram -follow up after above for cystoscopy  Return for after CT for cystoscopy.  Nickie Retort, MD  [facility] [address], [place], [place] [address] [phone]

## 2017-02-05 ENCOUNTER — Telehealth: Payer: Self-pay | Admitting: Urology

## 2017-02-05 NOTE — Telephone Encounter (Signed)
FYI patient cx her cysto and CT scan and did not want to reschd it  michelle

## 2017-02-20 ENCOUNTER — Other Ambulatory Visit: Payer: BLUE CROSS/BLUE SHIELD

## 2017-08-19 ENCOUNTER — Other Ambulatory Visit: Payer: Self-pay | Admitting: Obstetrics and Gynecology

## 2017-08-19 DIAGNOSIS — Z1231 Encounter for screening mammogram for malignant neoplasm of breast: Secondary | ICD-10-CM

## 2017-09-09 ENCOUNTER — Ambulatory Visit
Admission: RE | Admit: 2017-09-09 | Discharge: 2017-09-09 | Disposition: A | Discharge status: Home / Self Care | Payer: BLUE CROSS/BLUE SHIELD | Source: Ambulatory Visit | Attending: Obstetrics and Gynecology | Admitting: Obstetrics and Gynecology

## 2017-09-09 DIAGNOSIS — Z1231 Encounter for screening mammogram for malignant neoplasm of breast: Secondary | ICD-10-CM | POA: Insufficient documentation

## 2017-10-21 DIAGNOSIS — C801 Malignant (primary) neoplasm, unspecified: Secondary | ICD-10-CM

## 2017-10-21 HISTORY — DX: Malignant (primary) neoplasm, unspecified: C80.1

## 2018-07-13 DIAGNOSIS — Z780 Asymptomatic menopausal state: Secondary | ICD-10-CM | POA: Insufficient documentation

## 2018-08-06 ENCOUNTER — Ambulatory Visit (INDEPENDENT_AMBULATORY_CARE_PROVIDER_SITE_OTHER): Payer: BLUE CROSS/BLUE SHIELD | Admitting: Urology

## 2018-08-06 ENCOUNTER — Encounter: Payer: Self-pay | Admitting: Urology

## 2018-08-06 ENCOUNTER — Other Ambulatory Visit: Payer: Self-pay

## 2018-08-06 VITALS — BP 146/85 | HR 75 | Ht 66.0 in | Wt 162.0 lb

## 2018-08-06 DIAGNOSIS — R3129 Other microscopic hematuria: Secondary | ICD-10-CM

## 2018-08-06 LAB — URINALYSIS, COMPLETE
Bilirubin, UA: NEGATIVE
GLUCOSE, UA: NEGATIVE
KETONES UA: NEGATIVE
Leukocytes, UA: NEGATIVE
NITRITE UA: NEGATIVE
Protein, UA: NEGATIVE
SPEC GRAV UA: 1.01 (ref 1.005–1.030)
UUROB: 0.2 mg/dL (ref 0.2–1.0)
pH, UA: 7 (ref 5.0–7.5)

## 2018-08-06 LAB — MICROSCOPIC EXAMINATION
Bacteria, UA: NONE SEEN
Epithelial Cells (non renal): NONE SEEN /hpf (ref 0–10)
WBC UA: NONE SEEN /HPF (ref 0–5)

## 2018-08-06 NOTE — Progress Notes (Signed)
57 yo F with personal history of microscopic hematuria who presented to clinic today.  She had the scan done at an OSH yesterday and does not have the disk or report with her.  This is not availible to me today.    As such, we discussed that it makes most sense to have her return for cystoscopy in a few weeks and bring the disk with her at this f/u for [place] to review personally.  She is agreeable with this plan.   Hollice Espy, MD

## 2018-08-13 ENCOUNTER — Other Ambulatory Visit: Payer: Self-pay | Admitting: Radiology

## 2018-08-13 ENCOUNTER — Encounter: Payer: Self-pay | Admitting: Urology

## 2018-08-13 ENCOUNTER — Ambulatory Visit (INDEPENDENT_AMBULATORY_CARE_PROVIDER_SITE_OTHER): Payer: BLUE CROSS/BLUE SHIELD | Admitting: Urology

## 2018-08-13 VITALS — BP 125/83 | HR 91 | Ht 66.0 in | Wt 160.0 lb

## 2018-08-13 DIAGNOSIS — D494 Neoplasm of unspecified behavior of bladder: Secondary | ICD-10-CM

## 2018-08-13 DIAGNOSIS — R3129 Other microscopic hematuria: Secondary | ICD-10-CM | POA: Diagnosis not present

## 2018-08-13 LAB — URINALYSIS, COMPLETE
BILIRUBIN UA: NEGATIVE
Glucose, UA: NEGATIVE
Ketones, UA: NEGATIVE
LEUKOCYTES UA: NEGATIVE
Nitrite, UA: NEGATIVE
PH UA: 7 (ref 5.0–7.5)
Protein, UA: NEGATIVE
Specific Gravity, UA: 1.015 (ref 1.005–1.030)
Urobilinogen, Ur: 0.2 mg/dL (ref 0.2–1.0)

## 2018-08-13 LAB — MICROSCOPIC EXAMINATION
BACTERIA UA: NONE SEEN
EPITHELIAL CELLS (NON RENAL): NONE SEEN /HPF (ref 0–10)
WBC, UA: NONE SEEN /hpf (ref 0–5)

## 2018-08-13 MED ORDER — SODIUM CHLORIDE 0.9 % IR SOLN: 2000.0000 mg | Freq: Once | Status: DC

## 2018-08-13 MED ORDER — LIDOCAINE HCL URETHRAL/MUCOSAL 2 % EX GEL
1.0000 "application " | Freq: Once | CUTANEOUS | Status: AC
  Administered 2018-08-13: 1 via URETHRAL

## 2018-08-13 NOTE — Addendum Note (Signed)
Addended by: Tommy Rainwater on: 08/13/2018 10:28 AM   Modules accepted: Orders

## 2018-08-13 NOTE — H&P (View-Only) (Signed)
   08/13/18  CC:  Chief Complaint  Patient presents with  . Cysto    HPI: 57 year old female with a personal history of microscopic hematuria who presents today for office cystoscopy.    She had a CT scan report done at [place] for which she brings the disc today.  Was earlier this month.  No GU pathology was identified other than a simple renal cyst in each kidney, small.  Note former smoker, quit in 1988.  CT abd/ pelvis report from [facility]: Axial scans are obtained from the hemidiaphragms to symphysis pubis.  Intravenous and oral contrast material was administered.  The lung bases are clear.  Single subcentimeter cortical cyst in each kidney, kidneys otherwise have a normal appearance.  Both kidneys function normally.  No masses are identified.  No evidence for nephrolithiasis or ureterolithiasis.  The liver, spleen, pancreas, adrenal glands have a normal appearance.  The gallbladder is contracted.  No definite gallbladder abnormalities identified.  The appendix is not identified.  No free fluid or abnormal fluid collections.  No specific bowel abnormality.  Uterus has a normal appearance.  No adnexal masses.  No lytic or blastic lesions.  Impression: Single cortical cyst in each kidney, otherwise normal kidneys.  Blood pressure 125/83, pulse 91, height 5\' 6"  (1.676 m), weight 160 lb (72.6 kg). NED. A&Ox3.   No respiratory distress   Abd soft, NT, ND Normal external genitalia with patent urethral meatus  Cystoscopy Procedure Note  Patient identification was confirmed, informed consent was obtained, and patient was prepped using Betadine solution.  Lidocaine jelly was administered per urethral meatus.    Procedure: - Flexible cystoscope introduced, without any difficulty.   - Thorough search of the bladder revealed:    normal urethral meatus    normal urothelium    no stones    no ulcers     Small papillary tumor, less than 1 cm in size just beyond the  left ureteral orifice, somewhat superficial low-grade appearance    no urethral polyps    no trabeculation  - Ureteral orifices were normal in position and appearance.  Post-Procedure: - Patient tolerated the procedure well  Assessment/ Plan:  1. 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  All questions were answered. Preop urine culture.   Schedule TURBT (small), B RTC, intravesical chemo  Hollice Espy, MD

## 2018-08-13 NOTE — Progress Notes (Signed)
   08/13/18  CC:  Chief Complaint  Patient presents with  . Cysto    HPI: 57 year old female with a personal history of microscopic hematuria who presents today for office cystoscopy.    She had a CT scan report done at [place] for which she brings the disc today.  Was earlier this month.  No GU pathology was identified other than a simple renal cyst in each kidney, small.  Note former smoker, quit in 1988.  CT abd/ pelvis report from [facility]: Axial scans are obtained from the hemidiaphragms to symphysis pubis.  Intravenous and oral contrast material was administered.  The lung bases are clear.  Single subcentimeter cortical cyst in each kidney, kidneys otherwise have a normal appearance.  Both kidneys function normally.  No masses are identified.  No evidence for nephrolithiasis or ureterolithiasis.  The liver, spleen, pancreas, adrenal glands have a normal appearance.  The gallbladder is contracted.  No definite gallbladder abnormalities identified.  The appendix is not identified.  No free fluid or abnormal fluid collections.  No specific bowel abnormality.  Uterus has a normal appearance.  No adnexal masses.  No lytic or blastic lesions.  Impression: Single cortical cyst in each kidney, otherwise normal kidneys.  Blood pressure 125/83, pulse 91, height 5\' 6"  (1.676 m), weight 160 lb (72.6 kg). NED. A&Ox3.   No respiratory distress   Abd soft, NT, ND Normal external genitalia with patent urethral meatus  Cystoscopy Procedure Note  Patient identification was confirmed, informed consent was obtained, and patient was prepped using Betadine solution.  Lidocaine jelly was administered per urethral meatus.    Procedure: - Flexible cystoscope introduced, without any difficulty.   - Thorough search of the bladder revealed:    normal urethral meatus    normal urothelium    no stones    no ulcers     Small papillary tumor, less than 1 cm in size just beyond the  left ureteral orifice, somewhat superficial low-grade appearance    no urethral polyps    no trabeculation  - Ureteral orifices were normal in position and appearance.  Post-Procedure: - Patient tolerated the procedure well  Assessment/ Plan:  1. 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  All questions were answered. Preop urine culture.   Schedule TURBT (small), B RTC, intravesical chemo  Hollice Espy, MD

## 2018-08-16 LAB — CULTURE, URINE COMPREHENSIVE

## 2018-08-24 ENCOUNTER — Other Ambulatory Visit: Payer: Self-pay

## 2018-08-24 ENCOUNTER — Encounter
Admission: RE | Admit: 2018-08-24 | Discharge: 2018-08-24 | Disposition: A | Discharge status: Home / Self Care | Payer: BLUE CROSS/BLUE SHIELD | Source: Ambulatory Visit | Attending: Urology | Admitting: Urology

## 2018-08-24 DIAGNOSIS — E785 Hyperlipidemia, unspecified: Secondary | ICD-10-CM | POA: Diagnosis not present

## 2018-08-24 DIAGNOSIS — I10 Essential (primary) hypertension: Secondary | ICD-10-CM | POA: Insufficient documentation

## 2018-08-24 DIAGNOSIS — Z01810 Encounter for preprocedural cardiovascular examination: Secondary | ICD-10-CM | POA: Insufficient documentation

## 2018-08-24 HISTORY — DX: Adverse effect of unspecified anesthetic, initial encounter: T41.45XA

## 2018-08-24 HISTORY — DX: Other complications of anesthesia, initial encounter: T88.59XA

## 2018-08-24 NOTE — Patient Instructions (Addendum)
Your procedure is scheduled on: Monday 08/31/18 Report to [place]. To find out your arrival time please call [phone] between 1PM - 3PM on Friday 08/28/18.  Remember: Instructions that are not followed completely may result in serious medical risk, up to and including death, or upon the discretion of your surgeon and anesthesiologist your surgery may need to be rescheduled.     _X__ 1. Do not eat food after midnight the night before your procedure.                 No gum chewing or hard candies. You may drink clear liquids up to 2 hours                 before you are scheduled to arrive for your surgery- DO not drink clear                 liquids within 2 hours of the start of your surgery.                 Clear Liquids include:  water, apple juice without pulp, clear carbohydrate                 drink such as Clearfast or Gatorade, Black Coffee or Tea (Do not add                 anything to coffee or tea).  __X__2.  On the morning of surgery brush your teeth with toothpaste and water, you                 may rinse your mouth with mouthwash if you wish.  Do not swallow any              toothpaste of mouthwash.     _X__ 3.  No Alcohol for 24 hours before or after surgery.   _X__ 4.  Do Not Smoke or use e-cigarettes For 24 Hours Prior to Your Surgery.                 Do not use any chewable tobacco products for at least 6 hours prior to                 surgery.  ____  5.  Bring all medications with you on the day of surgery if instructed.   __X__  6.  Notify your doctor if there is any change in your medical condition      (cold, fever, infections).     Do not wear jewelry, make-up, hairpins, clips or nail polish. Do not wear lotions, powders, or perfumes.  Do not shave 48 hours prior to surgery. Men may shave face and neck. Do not bring valuables to the hospital.    [facility] is not responsible for any belongings or  valuables.  Contacts, dentures/partials or body piercings may not be worn into surgery. Bring a case for your contacts, glasses or hearing aids, a denture cup will be supplied. Leave your suitcase in the car. After surgery it may be brought to your room. For patients admitted to the hospital, discharge time is determined by your treatment team.   Patients discharged the day of surgery will not be allowed to drive home.   Please read over the following fact sheets that you were given:   MRSA Information  __X__ Take these medicines the morning of surgery with A SIP OF WATER:  1. none  2.   3.   4.  5.  6.  ____ Fleet Enema (as directed)   ____ Use CHG Soap/SAGE wipes as directed  ____ Use inhalers on the day of surgery  ____ Stop metformin/Janumet/Farxiga 2 days prior to surgery    ____ Take 1/2 of usual insulin dose the night before surgery. No insulin the morning          of surgery.   ____ Stop Blood Thinners Coumadin/Plavix/Xarelto/Pleta/Pradaxa/Eliquis/Effient/Aspirin  on   Or contact your Surgeon, Cardiologist or Medical Doctor regarding  ability to stop your blood thinners  __X__ Stop Anti-inflammatories 7 days before surgery such as Advil, Ibuprofen, Motrin,  BC or Goodies Powder, Naprosyn, Naproxen, Aleve, Aspirin    __X__ Stopall herbal supplements, fish oil or vitamin E until after surgery.    ____ Bring C-Pap to the hospital.

## 2018-08-25 ENCOUNTER — Other Ambulatory Visit: Payer: Self-pay | Admitting: Obstetrics and Gynecology

## 2018-08-25 DIAGNOSIS — Z1231 Encounter for screening mammogram for malignant neoplasm of breast: Secondary | ICD-10-CM

## 2018-08-30 MED ORDER — CEFAZOLIN SODIUM-DEXTROSE 2-4 GM/100ML-% IV SOLN
2.0000 g | INTRAVENOUS | Status: AC
  Administered 2018-08-31: 2 g via INTRAVENOUS

## 2018-08-31 ENCOUNTER — Other Ambulatory Visit: Payer: Self-pay

## 2018-08-31 ENCOUNTER — Encounter
Admission: RE | Disposition: A | Discharge status: Home / Self Care | Payer: Self-pay | Source: Ambulatory Visit | Attending: Urology

## 2018-08-31 ENCOUNTER — Encounter: Payer: Self-pay | Admitting: Emergency Medicine

## 2018-08-31 ENCOUNTER — Ambulatory Visit
Admission: RE | Admit: 2018-08-31 | Discharge: 2018-08-31 | Disposition: A | Discharge status: Home / Self Care | Payer: BLUE CROSS/BLUE SHIELD | Source: Ambulatory Visit | Attending: Urology | Admitting: Urology

## 2018-08-31 ENCOUNTER — Ambulatory Visit: Payer: BLUE CROSS/BLUE SHIELD | Admitting: Registered Nurse

## 2018-08-31 DIAGNOSIS — Z87891 Personal history of nicotine dependence: Secondary | ICD-10-CM | POA: Diagnosis not present

## 2018-08-31 DIAGNOSIS — R3129 Other microscopic hematuria: Secondary | ICD-10-CM | POA: Diagnosis present

## 2018-08-31 DIAGNOSIS — C672 Malignant neoplasm of lateral wall of bladder: Secondary | ICD-10-CM | POA: Insufficient documentation

## 2018-08-31 DIAGNOSIS — R311 Benign essential microscopic hematuria: Secondary | ICD-10-CM | POA: Diagnosis not present

## 2018-08-31 DIAGNOSIS — D494 Neoplasm of unspecified behavior of bladder: Secondary | ICD-10-CM | POA: Diagnosis present

## 2018-08-31 HISTORY — PX: CYSTOSCOPY W/ RETROGRADES: SHX1426

## 2018-08-31 HISTORY — PX: TRANSURETHRAL RESECTION OF BLADDER TUMOR WITH MITOMYCIN-C: SHX6459

## 2018-08-31 HISTORY — PX: CYSTOSCOPY WITH FULGERATION: SHX6638

## 2018-08-31 SURGERY — TRANSURETHRAL RESECTION OF BLADDER TUMOR WITH MITOMYCIN-C
Anesthesia: General

## 2018-08-31 MED ORDER — PHENAZOPYRIDINE HCL 200 MG PO TABS: 200.0000 mg | ORAL_TABLET | Freq: Three times a day (TID) | ORAL | 0 refills | Status: DC | PRN

## 2018-08-31 MED ORDER — IOTHALAMATE MEGLUMINE 43 % IV SOLN
INTRAVENOUS | Status: DC | PRN
  Administered 2018-08-31: 20 mL

## 2018-08-31 MED ORDER — FENTANYL CITRATE (PF) 100 MCG/2ML IJ SOLN
INTRAMUSCULAR | Status: DC | PRN
  Administered 2018-08-31: 50 ug via INTRAVENOUS

## 2018-08-31 MED ORDER — FENTANYL CITRATE (PF) 100 MCG/2ML IJ SOLN
INTRAMUSCULAR | Status: AC
  Filled 2018-08-31: qty 2

## 2018-08-31 MED ORDER — GLYCOPYRROLATE 0.2 MG/ML IJ SOLN
INTRAMUSCULAR | Status: DC | PRN
  Administered 2018-08-31: 0.2 mg via INTRAVENOUS

## 2018-08-31 MED ORDER — FENTANYL CITRATE (PF) 100 MCG/2ML IJ SOLN: 25.0000 ug | INTRAMUSCULAR | Status: DC | PRN

## 2018-08-31 MED ORDER — ONDANSETRON HCL 4 MG/2ML IJ SOLN
INTRAMUSCULAR | Status: AC
  Filled 2018-08-31: qty 2

## 2018-08-31 MED ORDER — FAMOTIDINE 20 MG PO TABS
20.0000 mg | ORAL_TABLET | Freq: Once | ORAL | Status: AC
  Administered 2018-08-31: 20 mg via ORAL

## 2018-08-31 MED ORDER — LIDOCAINE HCL (CARDIAC) PF 100 MG/5ML IV SOSY
PREFILLED_SYRINGE | INTRAVENOUS | Status: DC | PRN
  Administered 2018-08-31: 80 mg via INTRAVENOUS

## 2018-08-31 MED ORDER — SODIUM CHLORIDE FLUSH 0.9 % IV SOLN
INTRAVENOUS | Status: AC
  Filled 2018-08-31: qty 10

## 2018-08-31 MED ORDER — LACTATED RINGERS IV SOLN
INTRAVENOUS | Status: DC
  Administered 2018-08-31: 11:00:00 via INTRAVENOUS

## 2018-08-31 MED ORDER — PROPOFOL 10 MG/ML IV BOLUS
INTRAVENOUS | Status: DC | PRN
  Administered 2018-08-31: 200 mg via INTRAVENOUS

## 2018-08-31 MED ORDER — PHENYLEPHRINE HCL 10 MG/ML IJ SOLN
INTRAMUSCULAR | Status: DC | PRN
  Administered 2018-08-31: 100 ug via INTRAVENOUS

## 2018-08-31 MED ORDER — ONDANSETRON HCL 4 MG/2ML IJ SOLN
INTRAMUSCULAR | Status: DC | PRN
  Administered 2018-08-31: 4 mg via INTRAVENOUS

## 2018-08-31 MED ORDER — DEXAMETHASONE SODIUM PHOSPHATE 10 MG/ML IJ SOLN
INTRAMUSCULAR | Status: DC | PRN
  Administered 2018-08-31: 10 mg via INTRAVENOUS

## 2018-08-31 MED ORDER — PROMETHAZINE HCL 25 MG/ML IJ SOLN
6.2500 mg | INTRAMUSCULAR | Status: DC | PRN
  Administered 2018-08-31: 6.25 mg via INTRAVENOUS

## 2018-08-31 MED ORDER — DEXAMETHASONE SODIUM PHOSPHATE 10 MG/ML IJ SOLN
INTRAMUSCULAR | Status: AC
  Filled 2018-08-31: qty 1

## 2018-08-31 MED ORDER — PROMETHAZINE HCL 25 MG/ML IJ SOLN
INTRAMUSCULAR | Status: AC
  Administered 2018-08-31: 6.25 mg via INTRAVENOUS
  Filled 2018-08-31: qty 1

## 2018-08-31 MED ORDER — PROPOFOL 10 MG/ML IV BOLUS
INTRAVENOUS | Status: AC
  Filled 2018-08-31: qty 20

## 2018-08-31 MED ORDER — MIDAZOLAM HCL 2 MG/2ML IJ SOLN
INTRAMUSCULAR | Status: AC
  Filled 2018-08-31: qty 2

## 2018-08-31 MED ORDER — LIDOCAINE HCL (PF) 2 % IJ SOLN
INTRAMUSCULAR | Status: AC
  Filled 2018-08-31: qty 10

## 2018-08-31 MED ORDER — CEFAZOLIN SODIUM-DEXTROSE 2-4 GM/100ML-% IV SOLN
INTRAVENOUS | Status: AC
  Filled 2018-08-31: qty 100

## 2018-08-31 MED ORDER — OXYBUTYNIN CHLORIDE 5 MG PO TABS: 5.0000 mg | ORAL_TABLET | Freq: Three times a day (TID) | ORAL | 0 refills | Status: DC | PRN

## 2018-08-31 MED ORDER — FAMOTIDINE 20 MG PO TABS
ORAL_TABLET | ORAL | Status: AC
  Filled 2018-08-31: qty 1

## 2018-08-31 MED ORDER — GLYCOPYRROLATE 0.2 MG/ML IJ SOLN
INTRAMUSCULAR | Status: AC
  Filled 2018-08-31: qty 1

## 2018-08-31 SURGICAL SUPPLY — 33 items
BAG DRAIN CYSTO-URO LG1000N (MISCELLANEOUS) ×5 IMPLANT
BAG URINE DRAINAGE (UROLOGICAL SUPPLIES) ×5 IMPLANT
BRUSH SCRUB EZ  4% CHG (MISCELLANEOUS) ×2
BRUSH SCRUB EZ 4% CHG (MISCELLANEOUS) ×3 IMPLANT
CATH FOLEY 2WAY  5CC 16FR (CATHETERS) ×2
CATH URETL 5X70 OPEN END (CATHETERS) ×5 IMPLANT
CATH URTH 16FR FL 2W BLN LF (CATHETERS) ×3 IMPLANT
COVER WAND RF STERILE (DRAPES) ×5 IMPLANT
CRADLE LAMINECT ARM (MISCELLANEOUS) ×5 IMPLANT
DRAPE UTILITY 15X26 TOWEL STRL (DRAPES) ×5 IMPLANT
DRSG TELFA 4X3 1S NADH ST (GAUZE/BANDAGES/DRESSINGS) ×5 IMPLANT
ELECT LOOP 22F BIPOLAR SML (ELECTROSURGICAL)
ELECT REM PT RETURN 9FT ADLT (ELECTROSURGICAL)
ELECTRODE LOOP 22F BIPOLAR SML (ELECTROSURGICAL) IMPLANT
ELECTRODE REM PT RTRN 9FT ADLT (ELECTROSURGICAL) IMPLANT
GLOVE BIO SURGEON STRL SZ 6.5 (GLOVE) ×4 IMPLANT
GLOVE BIO SURGEONS STRL SZ 6.5 (GLOVE) ×1
GOWN STRL REUS W/ TWL LRG LVL3 (GOWN DISPOSABLE) ×6 IMPLANT
GOWN STRL REUS W/TWL LRG LVL3 (GOWN DISPOSABLE) ×4
KIT TURNOVER CYSTO (KITS) ×5 IMPLANT
LOOP CUT BIPOLAR 24F LRG (ELECTROSURGICAL) IMPLANT
NDL SAFETY ECLIPSE 18X1.5 (NEEDLE) ×3 IMPLANT
NEEDLE HYPO 18GX1.5 SHARP (NEEDLE) ×2
PACK CYSTO AR (MISCELLANEOUS) ×5 IMPLANT
SENSORWIRE 0.038 NOT ANGLED (WIRE) ×5
SET CYSTO W/LG BORE CLAMP LF (SET/KITS/TRAYS/PACK) ×5 IMPLANT
SET IRRIG Y TYPE TUR BLADDER L (SET/KITS/TRAYS/PACK) ×5 IMPLANT
SOL .9 NS 3000ML IRR  AL (IV SOLUTION) ×2
SOL .9 NS 3000ML IRR UROMATIC (IV SOLUTION) ×3 IMPLANT
SURGILUBE 2OZ TUBE FLIPTOP (MISCELLANEOUS) ×5 IMPLANT
SYRINGE IRR TOOMEY STRL 70CC (SYRINGE) ×5 IMPLANT
WATER STERILE IRR 1000ML POUR (IV SOLUTION) ×5 IMPLANT
WIRE SENSOR 0.038 NOT ANGLED (WIRE) ×3 IMPLANT

## 2018-08-31 NOTE — Discharge Instructions (Addendum)
Transurethral Resection of Bladder Tumor (TURBT) or Bladder Biopsy ° ° °Definition: ° Transurethral Resection of the Bladder Tumor is a surgical procedure used to diagnose and remove tumors within the bladder. TURBT is the most common treatment for early stage bladder cancer. ° °General instructions: °   ° Your recent bladder surgery requires very little post hospital care but some definite precautions. ° °Despite the fact that no skin incisions were used, the area around the bladder incisions are raw and covered with scabs to promote healing and prevent bleeding. Certain precautions are needed to insure that the scabs are not disturbed over the next 2-4 weeks while the healing proceeds. ° °Because the raw surface inside your bladder and the irritating effects of urine you may expect frequency of urination and/or urgency (a stronger desire to urinate) and perhaps even getting up at night more often. This will usually resolve or improve slowly over the healing period. You may see some blood in your urine over the first 6 weeks. Do not be alarmed, even if the urine was clear for a while. Get off your feet and drink lots of fluids until clearing occurs. If you start to pass clots or don't improve call us. ° °Diet: ° °You may return to your normal diet immediately. Because of the raw surface of your bladder, alcohol, spicy foods, foods high in acid and drinks with caffeine may cause irritation or frequency and should be used in moderation. To keep your urine flowing freely and avoid constipation, drink plenty of fluids during the day (8-10 glasses). Tip: Avoid cranberry juice because it is very acidic. ° °Activity: ° °Your physical activity doesn't need to be restricted. However, if you are very active, you may see some blood in the urine. We suggest that you reduce your activity under the circumstances until the bleeding has stopped. ° °Bowels: ° °It is important to keep your bowels regular during the postoperative  period. Straining with bowel movements can cause bleeding. A bowel movement every other day is reasonable. Use a mild laxative if needed, such as milk of magnesia 2-3 tablespoons, or 2 Dulcolax tablets. Call if you continue to have problems. If you had been taking narcotics for pain, before, during or after your surgery, you may be constipated. Take a laxative if necessary. ° ° ° °Medication: ° °You should resume your pre-surgery medications unless told not to. In addition you may be given an antibiotic to prevent or treat infection. Antibiotics are not always necessary. All medication should be taken as prescribed until the bottles are finished unless you are having an unusual reaction to one of the drugs. ° ° °[facility] °[place], [place] [address] °[phone] ° ° ° °AMBULATORY SURGERY  °DISCHARGE INSTRUCTIONS ° ° °1) The drugs that you were given will stay in your system until tomorrow so for the next 24 hours you should not: ° °A) Drive an automobile °B) Make any legal decisions °C) Drink any alcoholic beverage ° ° °2) You may resume regular meals tomorrow.  Today it is better to start with liquids and gradually work up to solid foods. ° °You may eat anything you prefer, but it is better to start with liquids, then soup and crackers, and gradually work up to solid foods. ° ° °3) Please notify your doctor immediately if you have any unusual bleeding, trouble breathing, redness and pain at the surgery site, drainage, fever, or pain not relieved by medication. ° ° ° °4) Additional Instructions: ° ° ° ° ° ° ° °  Please contact your physician with any problems or Same Day Surgery at [phone], Monday through Friday 6 am to 4 pm, or [place] at  Main number at [phone]. ° °

## 2018-08-31 NOTE — Interval H&P Note (Signed)
History and Physical Interval Note:  08/31/2018 12:03 PM  Andrea Arnold  has presented today for surgery, with the diagnosis of bladder mass  The various methods of treatment have been discussed with the patient and family. After consideration of risks, benefits and other options for treatment, the patient has consented to  Procedure(s): TRANSURETHRAL RESECTION OF BLADDER TUMOR WITH Gemcitabine (N/A) CYSTOSCOPY WITH RETROGRADE PYELOGRAM (Bilateral) as a surgical intervention .  The patient's history has been reviewed, patient examined, no change in status, stable for surgery.  I have reviewed the patient's chart and labs.  Questions were answered to the patient's satisfaction.    RRR CTAB  Hollice Espy

## 2018-08-31 NOTE — Transfer of Care (Signed)
Immediate Anesthesia Transfer of Care Note  Patient: Andrea Arnold  Procedure(s) Performed: TRANSURETHRAL RESECTION OF BLADDER TUMOR WITH Gemcitabine (N/A ) CYSTOSCOPY WITH RETROGRADE PYELOGRAM (Bilateral )  Patient Location: PACU  Anesthesia Type:General  Level of Consciousness: sedated  Airway & Oxygen Therapy: Patient Spontanous Breathing and Patient connected to face mask oxygen  Post-op Assessment: Report given to RN and Post -op Vital signs reviewed and stable  Post vital signs: Reviewed and stable  Last Vitals:  Vitals Value Taken Time  BP 120/87 08/31/2018  1:16 PM  Temp 36.1 C 08/31/2018  1:16 PM  Pulse 76 08/31/2018  1:17 PM  Resp 9 08/31/2018  1:17 PM  SpO2 100 % 08/31/2018  1:17 PM  Vitals shown include unvalidated device data.  Last Pain:  Vitals:   08/31/18 1316  TempSrc:   PainSc: (P) Asleep         Complications: No apparent anesthesia complications

## 2018-08-31 NOTE — Anesthesia Preprocedure Evaluation (Signed)
Anesthesia Evaluation  Patient identified by MRN, date of birth, ID band Patient awake    Reviewed: Allergy & Precautions, H&P , NPO status , Patient's Chart, lab work & pertinent test results, reviewed documented beta blocker date and time   History of Anesthesia Complications (+) PROLONGED EMERGENCE and history of anesthetic complications  Airway Mallampati: I  TM Distance: >3 FB Neck ROM: full    Dental  (+) Dental Advidsory Given, Teeth Intact   Pulmonary neg pulmonary ROS, former smoker,           Cardiovascular Exercise Tolerance: Good negative cardio ROS       Neuro/Psych negative neurological ROS  negative psych ROS   GI/Hepatic negative GI ROS, Neg liver ROS,   Endo/Other  negative endocrine ROS  Renal/GU negative Renal ROS  negative genitourinary   Musculoskeletal   Abdominal   Peds  Hematology negative hematology ROS (+)   Anesthesia Other Findings Past Medical History: No date: Chicken pox No date: Complication of anesthesia     Comment:  disoriented and took a long time to feel normal after               anesthesia No date: Hyperlipidemia No date: Hypertension   Reproductive/Obstetrics negative OB ROS                             Anesthesia Physical Anesthesia Plan  ASA: I  Anesthesia Plan: General   Post-op Pain Management:    Induction: Intravenous  PONV Risk Score and Plan: 3 and Ondansetron, Dexamethasone, Promethazine, Treatment may vary due to age or medical condition and Midazolam  Airway Management Planned: LMA  Additional Equipment:   Intra-op Plan:   Post-operative Plan: Extubation in OR  Informed Consent: I have reviewed the patients History and Physical, chart, labs and discussed the procedure including the risks, benefits and alternatives for the proposed anesthesia with the patient or authorized representative who has indicated his/her  understanding and acceptance.   Dental Advisory Given  Plan Discussed with: Anesthesiologist, CRNA and Surgeon  Anesthesia Plan Comments:         Anesthesia Quick Evaluation

## 2018-08-31 NOTE — Op Note (Signed)
Date of procedure: 08/31/18  Preoperative diagnosis:  1. Bladder tumor 2. Microscopic hematuria  Postoperative diagnosis:  1. Same as above  Procedure: 1. TURBT, small 2. Bilateral retrograde pyelogram 3. Instillation of intravesical chemotherapy, gemcitabine  Surgeon: Hollice Espy, MD  Anesthesia: General  Complications: None  Intraoperative findings: Small papillary tumor with low-grade appearance, approximately 1 cm distally on the left UO.  EBL: Minimal  Specimens: Bladder tumor, left lateral wall  Drains: 16 French Foley  Indication: Andrea Arnold is a 57 y.o. patient with microscopic hematuria found to have approximately 1 cm bladder tumor just on the left UO.  After reviewing the management options for treatment, she elected to proceed with the above surgical procedure(s). We have discussed the potential benefits and risks of the procedure, side effects of the proposed treatment, the likelihood of the patient achieving the goals of the procedure, and any potential problems that might occur during the procedure or recuperation. Informed consent has been obtained.  Description of procedure:  The patient was taken to the operating room and general anesthesia was induced.  The patient was placed in the dorsal lithotomy position, prepped and draped in the usual sterile fashion, and preoperative antibiotics were administered. A preoperative time-out was performed.   A 21 French scope was advanced per urethra into the bladder.  The bladder was carefully inspected.  There were no lesions identified other than a 1 cm papillary tumor with multiple fronds on the left lateral bladder wall beyond the left UO.  Bilateral UOs were identified and free of any tumors or lesions.  There is clear reflux of urine from both.  Attention was first taken to the right ureteral orifice a 5 French open-ended ureteral catheter.  Gentle retrograde pyelogram was performed which showed no filling  defects or hydroureteronephrosis.  The same exact procedure was performed on the left side with the same technique and findings.  Bilateral retrograde pyelogram was unremarkable.  Next, cold cup biopsy forceps were used to enucleate the tumor in a pea-sized fashion.  One deeper bite was taken and sent off with a specimen.  Once no residual tumor remained, Bugbee electrocautery was used to fulgurate the area.  This was away from the UO and there is clear reflux of urine noted from the orifice after completed.  Hemostasis was excellent.  A 16 French Foley catheter was then placed and the balloon was filled with 10 cc of sterile water.  She was then clean and dry, repositioned supine position, reversed from anesthesia, taken to PACU in stable condition.  2000 mg of intravesical mitomycin was instilled into the bladder.  This was a lab to dwell the PACU for 1 hour.  After 1 hour, the catheter was unclamped and the chemo was drained.  This was well-tolerated.  Plan: I will call her with her pathology results.  She will return in about 3 months for cystoscopy as deemed appropriate.  Hollice Espy, M.D.

## 2018-08-31 NOTE — Anesthesia Procedure Notes (Signed)
Procedure Name: LMA Insertion Date/Time: 08/31/2018 12:34 PM Performed by: Doreen Salvage, CRNA Pre-anesthesia Checklist: Patient identified, Patient being monitored, Timeout performed, Emergency Drugs available and Suction available Patient Re-evaluated:Patient Re-evaluated prior to induction Oxygen Delivery Method: Circle system utilized Preoxygenation: Pre-oxygenation with 100% oxygen Induction Type: IV induction Ventilation: Mask ventilation without difficulty LMA: LMA inserted LMA Size: 4.0 Tube type: Oral Number of attempts: 1 Placement Confirmation: positive ETCO2 and breath sounds checked- equal and bilateral Tube secured with: Tape Dental Injury: Teeth and Oropharynx as per pre-operative assessment

## 2018-08-31 NOTE — Anesthesia Post-op Follow-up Note (Signed)
Anesthesia QCDR form completed.        

## 2018-09-01 ENCOUNTER — Encounter: Payer: Self-pay | Admitting: Urology

## 2018-09-01 LAB — SURGICAL PATHOLOGY

## 2018-09-01 NOTE — Anesthesia Postprocedure Evaluation (Signed)
Anesthesia Post Note  Patient: Andrea Arnold  Procedure(s) Performed: TRANSURETHRAL RESECTION OF BLADDER TUMOR WITH Gemcitabine (N/A ) CYSTOSCOPY WITH RETROGRADE PYELOGRAM (Bilateral ) CYSTOSCOPY WITH FULGERATION  Patient location during evaluation: PACU Anesthesia Type: General Level of consciousness: awake and alert Pain management: pain level controlled Vital Signs Assessment: post-procedure vital signs reviewed and stable Respiratory status: spontaneous breathing, nonlabored ventilation, respiratory function stable and patient connected to nasal cannula oxygen Cardiovascular status: blood pressure returned to baseline and stable Postop Assessment: no apparent nausea or vomiting Anesthetic complications: no     Last Vitals:  Vitals:   08/31/18 1401 08/31/18 1428  BP:  (!) 151/82  Pulse: 69 65  Resp: 12 14  Temp: (!) 36.3 C (!) 36.2 C  SpO2: 100% 100%    Last Pain:  Vitals:   09/01/18 0809  TempSrc:   PainSc: 0-No pain                 Martha Clan

## 2018-09-02 ENCOUNTER — Telehealth: Payer: Self-pay | Admitting: Urology

## 2018-09-02 NOTE — Telephone Encounter (Signed)
Reviewed surgical pathology with patient by telephone today.  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  No further treatment needed.  She will follow-up for surveillance cystoscopy 3 months as scheduled.  We will spread out her cystoscopies thereafter based on guidelines.  Hollice Espy, MD

## 2018-09-29 ENCOUNTER — Ambulatory Visit
Admission: RE | Admit: 2018-09-29 | Discharge: 2018-09-29 | Disposition: A | Discharge status: Home / Self Care | Payer: BLUE CROSS/BLUE SHIELD | Source: Ambulatory Visit | Attending: Obstetrics and Gynecology | Admitting: Obstetrics and Gynecology

## 2018-09-29 DIAGNOSIS — Z1231 Encounter for screening mammogram for malignant neoplasm of breast: Secondary | ICD-10-CM | POA: Insufficient documentation

## 2018-09-29 HISTORY — DX: Malignant (primary) neoplasm, unspecified: C80.1

## 2018-11-28 NOTE — Progress Notes (Signed)
   12/02/2018   CC:  Chief Complaint  Patient presents with  . Cysto    HPI: Andrea Arnold is a 58 y.o. female who presents today for a 79-month surveillance cystoscopy.  Cystoscopy in 07/2018 following an initial episode of microscopic hematuria revealed a small bladder tumor.   On 08/31/2018, she underwent a transurethral resection of bladder tumor with bilobar retrogrades with findings of: Small, approximately 1 cm papillary urothelial carcinoma, low-grade, non-invasive, located distally on the left UO.  Blood pressure (!) 159/97, pulse 83, height 5\' 6"  (1.676 m), weight 155 lb (70.3 kg). NED. A&Ox3.   No respiratory distress   Abd soft, NT, ND Normal external genitalia with patent urethral meatus  Cystoscopy Procedure Note  Patient identification was confirmed, informed consent was obtained, and patient was prepped using Betadine solution.  Lidocaine jelly was administered per urethral meatus.    Procedure: - Flexible cystoscope introduced, without any difficulty.   - Thorough search of the bladder revealed:    normal urethral meatus    normal urothelium    no stones    no ulcers     no tumors    no urethral polyps    no trabeculation    stellate scar just beyond left UO with no evidence of recurrence  - Ureteral orifices were normal in position and appearance.  Post-Procedure: - Patient tolerated the procedure well  Assessment/ Plan:  1. Bladder cancer - NED today on cystoscopy - Return for surveillance cystoscopy in 6 months per [facility] guideleines   Return in about 6 months (around 06/02/2019) for Cysto.  I, Adele Schilder, am acting as a scribe for Hollice Espy, MD.    I have reviewed the above documentation for accuracy and completeness, and I agree with the above.   Hollice Espy, MD

## 2018-12-02 ENCOUNTER — Encounter: Payer: Self-pay | Admitting: Urology

## 2018-12-02 ENCOUNTER — Ambulatory Visit (INDEPENDENT_AMBULATORY_CARE_PROVIDER_SITE_OTHER): Payer: BLUE CROSS/BLUE SHIELD | Admitting: Urology

## 2018-12-02 VITALS — BP 159/97 | HR 83 | Ht 66.0 in | Wt 155.0 lb

## 2018-12-02 DIAGNOSIS — R3129 Other microscopic hematuria: Secondary | ICD-10-CM | POA: Diagnosis not present

## 2018-12-02 DIAGNOSIS — Z8551 Personal history of malignant neoplasm of bladder: Secondary | ICD-10-CM

## 2018-12-02 LAB — MICROSCOPIC EXAMINATION
Bacteria, UA: NONE SEEN
Epithelial Cells (non renal): NONE SEEN /hpf (ref 0–10)
WBC, UA: NONE SEEN /hpf (ref 0–5)

## 2018-12-02 LAB — URINALYSIS, COMPLETE
Bilirubin, UA: NEGATIVE
GLUCOSE, UA: NEGATIVE
KETONES UA: NEGATIVE
Leukocytes, UA: NEGATIVE
NITRITE UA: NEGATIVE
Protein, UA: NEGATIVE
Specific Gravity, UA: 1.02 (ref 1.005–1.030)
Urobilinogen, Ur: 0.2 mg/dL (ref 0.2–1.0)
pH, UA: 7.5 (ref 5.0–7.5)

## 2018-12-08 ENCOUNTER — Encounter: Payer: Self-pay | Admitting: Urology

## 2019-01-22 ENCOUNTER — Emergency Department (HOSPITAL_COMMUNITY): Payer: BLUE CROSS/BLUE SHIELD

## 2019-01-22 ENCOUNTER — Other Ambulatory Visit: Payer: Self-pay

## 2019-01-22 ENCOUNTER — Encounter (HOSPITAL_COMMUNITY): Payer: Self-pay

## 2019-01-22 ENCOUNTER — Inpatient Hospital Stay (HOSPITAL_COMMUNITY)
Admission: EM | Admit: 2019-01-22 | Discharge: 2019-01-25 | DRG: 087 | Disposition: A | Discharge status: Home Health | Payer: BLUE CROSS/BLUE SHIELD | Attending: Neurosurgery | Admitting: Neurosurgery

## 2019-01-22 DIAGNOSIS — Z87891 Personal history of nicotine dependence: Secondary | ICD-10-CM

## 2019-01-22 DIAGNOSIS — Z79899 Other long term (current) drug therapy: Secondary | ICD-10-CM | POA: Diagnosis not present

## 2019-01-22 DIAGNOSIS — Z91041 Radiographic dye allergy status: Secondary | ICD-10-CM | POA: Diagnosis not present

## 2019-01-22 DIAGNOSIS — I10 Essential (primary) hypertension: Secondary | ICD-10-CM | POA: Diagnosis present

## 2019-01-22 DIAGNOSIS — Y92009 Unspecified place in unspecified non-institutional (private) residence as the place of occurrence of the external cause: Secondary | ICD-10-CM

## 2019-01-22 DIAGNOSIS — W010XXA Fall on same level from slipping, tripping and stumbling without subsequent striking against object, initial encounter: Secondary | ICD-10-CM | POA: Diagnosis present

## 2019-01-22 DIAGNOSIS — Z8349 Family history of other endocrine, nutritional and metabolic diseases: Secondary | ICD-10-CM | POA: Diagnosis not present

## 2019-01-22 DIAGNOSIS — I619 Nontraumatic intracerebral hemorrhage, unspecified: Secondary | ICD-10-CM | POA: Diagnosis present

## 2019-01-22 DIAGNOSIS — Z8619 Personal history of other infectious and parasitic diseases: Secondary | ICD-10-CM | POA: Diagnosis not present

## 2019-01-22 DIAGNOSIS — E785 Hyperlipidemia, unspecified: Secondary | ICD-10-CM | POA: Diagnosis present

## 2019-01-22 DIAGNOSIS — Y9301 Activity, walking, marching and hiking: Secondary | ICD-10-CM | POA: Diagnosis present

## 2019-01-22 DIAGNOSIS — Z8249 Family history of ischemic heart disease and other diseases of the circulatory system: Secondary | ICD-10-CM

## 2019-01-22 DIAGNOSIS — S06351A Traumatic hemorrhage of left cerebrum with loss of consciousness of 30 minutes or less, initial encounter: Principal | ICD-10-CM | POA: Diagnosis present

## 2019-01-22 LAB — URINALYSIS, ROUTINE W REFLEX MICROSCOPIC
Bilirubin Urine: NEGATIVE
Glucose, UA: NEGATIVE mg/dL
Ketones, ur: 20 mg/dL — AB
Leukocytes,Ua: NEGATIVE
Nitrite: NEGATIVE
Protein, ur: 100 mg/dL — AB
Specific Gravity, Urine: 1.018 (ref 1.005–1.030)
pH: 8 (ref 5.0–8.0)

## 2019-01-22 LAB — COMPREHENSIVE METABOLIC PANEL
ALT: 19 U/L (ref 0–44)
AST: 27 U/L (ref 15–41)
Albumin: 4.6 g/dL (ref 3.5–5.0)
Alkaline Phosphatase: 37 U/L — ABNORMAL LOW (ref 38–126)
Anion gap: 13 (ref 5–15)
BUN: 14 mg/dL (ref 6–20)
CO2: 21 mmol/L — ABNORMAL LOW (ref 22–32)
Calcium: 9.3 mg/dL (ref 8.9–10.3)
Chloride: 101 mmol/L (ref 98–111)
Creatinine, Ser: 0.97 mg/dL (ref 0.44–1.00)
GFR calc Af Amer: >60 mL/min (ref >60)
GFR calc non Af Amer: >60 mL/min (ref >60)
Glucose, Bld: 158 mg/dL — ABNORMAL HIGH (ref 70–99)
Potassium: 3.5 mmol/L (ref 3.5–5.1)
Sodium: 135 mmol/L (ref 135–145)
Total Bilirubin: 0.8 mg/dL (ref 0.3–1.2)
Total Protein: 7.2 g/dL (ref 6.5–8.1)

## 2019-01-22 LAB — POCT I-STAT EG7
Acid-base deficit: 1 mmol/L (ref 0.0–2.0)
Bicarbonate: 23.6 mmol/L (ref 20.0–28.0)
Calcium, Ion: 1.11 mmol/L — ABNORMAL LOW (ref 1.15–1.40)
HCT: 42 % (ref 36.0–46.0)
Hemoglobin: 14.3 g/dL (ref 12.0–15.0)
O2 Saturation: 96 %
Potassium: 3.5 mmol/L (ref 3.5–5.1)
Sodium: 137 mmol/L (ref 135–145)
TCO2: 25 mmol/L (ref 22–32)
pCO2, Ven: 39.1 mmHg — ABNORMAL LOW (ref 44.0–60.0)
pH, Ven: 7.389 (ref 7.250–7.430)
pO2, Ven: 86 mmHg — ABNORMAL HIGH (ref 32.0–45.0)

## 2019-01-22 LAB — CBC WITH DIFFERENTIAL/PLATELET
Abs Immature Granulocytes: 0.05 10*3/uL (ref 0.00–0.07)
Basophils Absolute: 0 10*3/uL (ref 0.0–0.1)
Basophils Relative: 0 %
Eosinophils Absolute: 0 10*3/uL (ref 0.0–0.5)
Eosinophils Relative: 0 %
HCT: 43.8 % (ref 36.0–46.0)
Hemoglobin: 14.2 g/dL (ref 12.0–15.0)
Immature Granulocytes: 0 %
Lymphocytes Relative: 4 %
Lymphs Abs: 0.5 10*3/uL — ABNORMAL LOW (ref 0.7–4.0)
MCH: 28.2 pg (ref 26.0–34.0)
MCHC: 32.4 g/dL (ref 30.0–36.0)
MCV: 86.9 fL (ref 80.0–100.0)
Monocytes Absolute: 0.4 10*3/uL (ref 0.1–1.0)
Monocytes Relative: 3 %
Neutro Abs: 11.8 10*3/uL — ABNORMAL HIGH (ref 1.7–7.7)
Neutrophils Relative %: 93 %
Platelets: 264 10*3/uL (ref 150–400)
RBC: 5.04 MIL/uL (ref 3.87–5.11)
RDW: 12.4 % (ref 11.5–15.5)
WBC: 12.8 10*3/uL — ABNORMAL HIGH (ref 4.0–10.5)
nRBC: 0 % (ref 0.0–0.2)

## 2019-01-22 LAB — ETHANOL: Alcohol, Ethyl (B): <10 mg/dL (ref <10)

## 2019-01-22 LAB — SALICYLATE LEVEL: Salicylate Lvl: <7 mg/dL (ref 2.8–30.0)

## 2019-01-22 LAB — MRSA PCR SCREENING: MRSA by PCR: NEGATIVE

## 2019-01-22 LAB — PROTIME-INR
INR: 1.1 (ref 0.8–1.2)
Prothrombin Time: 14 seconds (ref 11.4–15.2)

## 2019-01-22 LAB — CK: Total CK: 82 U/L (ref 38–234)

## 2019-01-22 LAB — TSH: TSH: 0.621 u[IU]/mL (ref 0.350–4.500)

## 2019-01-22 LAB — RAPID URINE DRUG SCREEN, HOSP PERFORMED
Amphetamines: NOT DETECTED
Barbiturates: NOT DETECTED
Benzodiazepines: NOT DETECTED
Cocaine: NOT DETECTED
Opiates: NOT DETECTED
Tetrahydrocannabinol: NOT DETECTED

## 2019-01-22 LAB — TROPONIN I: Troponin I: <0.03 ng/mL (ref <0.03)

## 2019-01-22 LAB — LACTIC ACID, PLASMA
Lactic Acid, Venous: 2.8 mmol/L (ref 0.5–1.9)
Lactic Acid, Venous: 2.9 mmol/L (ref 0.5–1.9)

## 2019-01-22 LAB — AMMONIA: Ammonia: 14 umol/L (ref 9–35)

## 2019-01-22 MED ORDER — LABETALOL HCL 5 MG/ML IV SOLN: 20.0000 mg | Freq: Once | INTRAVENOUS | Status: DC

## 2019-01-22 MED ORDER — NICARDIPINE HCL IN NACL 20-0.86 MG/200ML-% IV SOLN
0.0000 mg/h | INTRAVENOUS | Status: DC
  Administered 2019-01-22 (×3): 5 mg/h via INTRAVENOUS
  Filled 2019-01-22 (×3): qty 200

## 2019-01-22 MED ORDER — HYDROMORPHONE HCL 1 MG/ML IJ SOLN: 0.5000 mg | INTRAMUSCULAR | Status: DC | PRN

## 2019-01-22 MED ORDER — ACETAMINOPHEN 650 MG RE SUPP: 650.0000 mg | RECTAL | Status: DC | PRN

## 2019-01-22 MED ORDER — ONDANSETRON HCL 4 MG/2ML IJ SOLN
4.0000 mg | Freq: Four times a day (QID) | INTRAMUSCULAR | Status: DC | PRN
  Administered 2019-01-22 – 2019-01-23 (×3): 4 mg via INTRAVENOUS
  Filled 2019-01-22 (×2): qty 2

## 2019-01-22 MED ORDER — SODIUM CHLORIDE 0.9 % IV BOLUS
500.0000 mL | Freq: Once | INTRAVENOUS | Status: AC
  Administered 2019-01-22: 500 mL via INTRAVENOUS

## 2019-01-22 MED ORDER — ONDANSETRON HCL 4 MG/2ML IJ SOLN
INTRAMUSCULAR | Status: AC
  Filled 2019-01-22: qty 2

## 2019-01-22 MED ORDER — STROKE: EARLY STAGES OF RECOVERY BOOK: Freq: Once | Status: DC

## 2019-01-22 MED ORDER — ACETAMINOPHEN 160 MG/5ML PO SOLN: 650.0000 mg | ORAL | Status: DC | PRN

## 2019-01-22 MED ORDER — ACETAMINOPHEN 325 MG PO TABS: 650.0000 mg | ORAL_TABLET | ORAL | Status: DC | PRN

## 2019-01-22 MED ORDER — SODIUM CHLORIDE 0.9 % IV SOLN
INTRAVENOUS | Status: DC
  Administered 2019-01-22: 16:00:00 via INTRAVENOUS

## 2019-01-22 MED ORDER — SENNOSIDES-DOCUSATE SODIUM 8.6-50 MG PO TABS
1.0000 | ORAL_TABLET | Freq: Two times a day (BID) | ORAL | Status: DC
  Administered 2019-01-23 – 2019-01-25 (×3): 1 via ORAL
  Filled 2019-01-22 (×4): qty 1

## 2019-01-22 MED ORDER — ACETAMINOPHEN-CODEINE #3 300-30 MG PO TABS
1.0000 | ORAL_TABLET | ORAL | Status: DC | PRN
  Administered 2019-01-23 – 2019-01-24 (×2): 1 via ORAL
  Filled 2019-01-22 (×2): qty 1

## 2019-01-22 MED ORDER — PANTOPRAZOLE SODIUM 40 MG IV SOLR
40.0000 mg | Freq: Every day | INTRAVENOUS | Status: DC
  Administered 2019-01-22 – 2019-01-24 (×3): 40 mg via INTRAVENOUS
  Filled 2019-01-22 (×3): qty 40

## 2019-01-22 NOTE — ED Notes (Signed)
Andrea Arnold Spouse [phone]

## 2019-01-22 NOTE — ED Provider Notes (Addendum)
[facility] EMERGENCY DEPARTMENT Provider Note   CSN: [id number] Arrival date & time: 01/22/19  1155    History   Chief Complaint No chief complaint on file.   HPI Andrea Arnold is a 58 y.o. female.     HPI Patient is healthy and active at baseline.  No anticoagulation medications.  She fell going to the bathroom last night at about 1 AM.  History is from the patient's husband over telephone.  He reports that she fell and he hurt it.  He went to check her and found her on her back briefly unresponsive.  She awakened and was oriented with normal speech and mental status.  He reports that she was coherent at that time.  He reports she was very nauseated.  She vomited just after the fall.  She was kind of agitated and vomited several more times before going to sleep.  He reports she was restless through the night.  Reports in the morning she awoke and had a vacillating level of coherence.  He reports at that time she seemed dazed and confused and could not recall his name.  At that time EMS was called for transport to the emergency department.  At baseline, she is very active and exercises daily without contributory medical history. 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   Chicken pox    Complication of anesthesia    disoriented and took a long time to feel normal after anesthesia   Hyperlipidemia    Hypertension     Patient Active Problem List   Diagnosis Date Noted   ICH (intracerebral hemorrhage) ([place]) 01/22/2019   Postmenopausal 07/13/2018   LGSIL (low grade squamous intraepithelial dysplasia) 01/22/2017   Hematuria, microscopic 01/06/2017   Dysuria 12/31/2016   Pure hypercholesterolemia 12/27/2016   Vitamin D deficiency 12/27/2016   Abnormal CXR 01/25/2016   Chronic fatigue 01/25/2016   Cough 01/17/2016   Routine general medical examination at health care facility 11/01/2013   Hyperlipidemia LDL goal <  100 09/23/2012   Abnormal EKG 09/23/2012    Past Surgical History:  Procedure Laterality Date   ABLATION  2004   CERVICAL BIOPSY  W/ LOOP ELECTRODE EXCISION  2015   COLONOSCOPY     CYSTOSCOPY W/ RETROGRADES Bilateral 08/31/2018   Procedure: CYSTOSCOPY WITH RETROGRADE PYELOGRAM;  Surgeon: Hollice Espy, MD;  Location: [facility] ORS;  Service: Urology;  Laterality: Bilateral;   CYSTOSCOPY WITH FULGERATION  08/31/2018   Procedure: CYSTOSCOPY WITH FULGERATION;  Surgeon: Hollice Espy, MD;  Location: [facility] ORS;  Service: Urology;;   [facility] ABLATION     TRANSURETHRAL RESECTION OF BLADDER TUMOR WITH MITOMYCIN-C N/A 08/31/2018   Procedure: TRANSURETHRAL RESECTION OF BLADDER TUMOR WITH Gemcitabine;  Surgeon: Hollice Espy, MD;  Location: [facility] ORS;  Service: Urology;  Laterality: N/A;     OB History   No obstetric history on file.      Home Medications    Prior to Admission medications   Medication Sig Start Date End Date Taking? Authorizing Provider  Cholecalciferol (VITAMIN D3 PO) Take 1 capsule by mouth at bedtime.    Yes [provider]  clotrimazole-betamethasone (LOTRISONE) cream Apply 1 application topically 2 (two) times daily as needed (for rash/irritated skin- to affected site(s)).    Yes [provider]  ibuprofen (ADVIL,MOTRIN) 200 MG tablet Take 200-400 mg by mouth every 6 (six) hours as needed for headache, mild pain or moderate pain.   Yes [provider]  Multiple Vitamin (MULTIVITAMIN) tablet Take 1 tablet by mouth daily.   Yes [provider]  rosuvastatin (CRESTOR) 5 MG tablet Take 5 mg by mouth at bedtime.  09/28/18  Yes [provider]    Family History Family History  Problem Relation Age of Onset   Alcohol abuse Sister    Drug abuse Sister    Cancer Sister 6       ovarian    Mental illness Sister    Hypertension Father    Hyperlipidemia Mother    Cancer Maternal Aunt        breast   Breast  cancer Maternal Aunt 10   Hyperlipidemia Maternal Grandmother    Heart disease Maternal Grandmother    Hypertension Maternal Grandmother    Hyperlipidemia Maternal Grandfather    Heart disease Maternal Grandfather    Hypertension Maternal Grandfather    Cancer Cousin        breast   Breast cancer Cousin 40    Social History Social History   Tobacco Use   Smoking status: Former Smoker    Packs/day: 1.00    Years: 10.00    Pack years: 10.00    Types: Cigarettes   Smokeless tobacco: Never Used  Substance Use Topics   Alcohol use: Yes    Alcohol/week: 4.0 standard drinks    Types: 4 Glasses of wine per week   Drug use: No     Allergies   Ivp dye [iodinated diagnostic agents]   Review of Systems Review of Systems Level 5 caveat cannot obtain review of systems due to confusion.  Physical Exam Updated Vital Signs BP (!) 154/91    Pulse 83    Temp 99 F (37.2 C) (Oral)    Resp (!) 21    SpO2 98%   Physical Exam Constitutional:      Comments: Patient is on the stretcher in her room, awake but confused appearance trying to use the telephone.  No respiratory distress.  HENT:     Head:     Comments: Head palpated, no palpable significant hematoma or laceration.    Nose: Nose normal.     Mouth/Throat:     Comments: Mucous membranes slightly dry.  Airway patent. Eyes:     Extraocular Movements: Extraocular movements intact.     Pupils: Pupils are equal, round, and reactive to light.     Comments: Pupils are symmetric.  Eye motions are sometimes slightly roving but conjugate.  Neck:     Comments: Patient does not endorse pain to palpation of the spine. Cardiovascular:     Rate and Rhythm: Normal rate and regular rhythm.  Pulmonary:     Effort: Pulmonary effort is normal.     Breath sounds: Normal breath sounds.  Abdominal:     General: There is no distension.     Palpations: Abdomen is soft.     Tenderness: There is no abdominal tenderness. There is no  guarding.  Musculoskeletal: Normal range of motion.        General: No swelling or deformity.  Skin:    General: Skin is warm and dry.  Neurological:     Comments: Patient is very confused.  Her speech is soft and mumbling and off subject.  She can only make limited responses but no meaningful historical information.  Patient will make effort at grip strength bilaterally.  Slightly weaker on the right.  She seems confused about following commands for elevating her legs off of the bed  but will drop both legs up and move them in response to commands.  Patient appears to have some right-sided facial droop.      ED Treatments / Results  Labs (all labs ordered are listed, but only abnormal results are displayed) Labs Reviewed  COMPREHENSIVE METABOLIC PANEL - Abnormal; Notable for the following components:      Result Value   CO2 21 (*)    Glucose, Bld 158 (*)    Alkaline Phosphatase 37 (*)    All other components within normal limits  LACTIC ACID, PLASMA - Abnormal; Notable for the following components:   Lactic Acid, Venous 2.8 (*)    All other components within normal limits  CBC WITH DIFFERENTIAL/PLATELET - Abnormal; Notable for the following components:   WBC 12.8 (*)    Neutro Abs 11.8 (*)    Lymphs Abs 0.5 (*)    All other components within normal limits  URINALYSIS, ROUTINE W REFLEX MICROSCOPIC - Abnormal; Notable for the following components:   Hgb urine dipstick MODERATE (*)    Ketones, ur 20 (*)    Protein, ur 100 (*)    Bacteria, UA RARE (*)    All other components within normal limits  POCT I-STAT EG7 - Abnormal; Notable for the following components:   pCO2, Ven 39.1 (*)    pO2, Ven 86.0 (*)    Calcium, Ion 1.11 (*)    All other components within normal limits  URINE CULTURE  CULTURE, BLOOD (ROUTINE X 2)  CULTURE, BLOOD (ROUTINE X 2)  ETHANOL  SALICYLATE LEVEL  TROPONIN I  PROTIME-INR  TSH  AMMONIA  CK  LACTIC ACID, PLASMA  RAPID URINE DRUG SCREEN, HOSP  PERFORMED  HIV ANTIBODY (ROUTINE TESTING W REFLEX)  I-STAT VENOUS BLOOD GAS, ED    EKG EKG Interpretation  Date/Time:  Friday January 22 2019 11:55:41 EDT Ventricular Rate:  97 PR Interval:    QRS Duration: 94 QT Interval:  375 QTC Calculation: 477 R Axis:   -36 Text Interpretation:  Sinus rhythm Left axis deviation RSR' in V1 or V2, probably normal variant no sig change Confirmed by Charlesetta Shanks [phone]) on 01/22/2019 3:46:13 PM   Radiology Ct Head Wo Contrast  Result Date: 01/22/2019 CLINICAL DATA:  Unwitnessed fall at home today. EXAM: CT HEAD WITHOUT CONTRAST CT CERVICAL SPINE WITHOUT CONTRAST TECHNIQUE: Multidetector CT imaging of the head and cervical spine was performed following the standard protocol without intravenous contrast. Multiplanar CT image reconstructions of the cervical spine were also generated. COMPARISON:  None. FINDINGS: CT HEAD FINDINGS Brain: Examination demonstrates a large acute parenchymal hematoma over the left frontal lobe measuring approximately 4 x 6.1 cm in transverse in AP dimension. There is adjacent edema with mass effect and midline shift to the right of 1 cm. There is a small amount of adjacent subarachnoid and subdural hemorrhage. Small amount of subdural hemorrhage is seen over the anterior aspect of the left middle cranial fossa. Likely small amount of acute subdural blood over the anterior interhemispheric fissure as well as tiny acute subdural hematoma over the left frontal parietal region best seen on the coronal views measuring 3 mm in thickness. Vascular: No hyperdense vessel or unexpected calcification. Skull: Nondisplaced left occipital bone fracture extending to the skull base involving the left lateral aspect of the foramen magnum. Sinuses/Orbits: No acute finding. Other: Minimal soft tissue swelling over the left occipital scalp. CT CERVICAL SPINE FINDINGS Motion artifact is present. Alignment: Normal. Skull base and vertebrae: Vertebral body  heights  are normal. There is mild spondylosis throughout the cervical spine. Atlantoaxial articulation is within normal. There is uncovertebral joint spurring. Non fusion of the posterior arch of C1. Mild facet arthropathy is present. No significant neural foraminal narrowing. There is a nondisplaced fracture over the left occipital bone extending to the left skull base involving the lateral aspect of the foramen magnum. Soft tissues and spinal canal: No prevertebral fluid or swelling. No visible canal hematoma. Disc levels:  Mild disc space narrowing at the C5-6 and C6-7 levels. Upper chest: Negative. Other: None. IMPRESSION: 1. Large acute parenchymal hematoma over the left frontal lobe with moderate mass effect and midline shift to the right of 1 cm. Adjacent small amount of acute subarachnoid and subdural hemorrhage as well as minimal subdural hemorrhage over the anterior aspect of the interhemispheric fissure, anterior aspect of the left temporal lobe as well as over the left frontoparietal region. Mild soft tissue swelling over the left occipital scalp. 2. Nondisplaced left occipital bone fracture extending to the skull base to the lateral aspect of the foramen magnum. 3.  No acute cervical spine injury. 4. Mild spondylosis of the cervical spine with mild disc disease at the C5-6 and C6-7 levels. Critical Value/emergent results were called by telephone at the time of interpretation on 01/22/2019 at 1:54 pm to the [facility] emergency department and given to PA who works with Dr. Johnney Killian, who verbally acknowledged these results. Dr. Johnney Killian is aware of the above findings and is discussing the case with neurosurgery at this time. Electronically Signed   By: Marin Olp M.D.   On: 01/22/2019 13:55   Ct Cervical Spine Wo Contrast  Result Date: 01/22/2019 CLINICAL DATA:  Unwitnessed fall at home today. EXAM: CT HEAD WITHOUT CONTRAST CT CERVICAL SPINE WITHOUT CONTRAST TECHNIQUE: Multidetector CT imaging of the  head and cervical spine was performed following the standard protocol without intravenous contrast. Multiplanar CT image reconstructions of the cervical spine were also generated. COMPARISON:  None. FINDINGS: CT HEAD FINDINGS Brain: Examination demonstrates a large acute parenchymal hematoma over the left frontal lobe measuring approximately 4 x 6.1 cm in transverse in AP dimension. There is adjacent edema with mass effect and midline shift to the right of 1 cm. There is a small amount of adjacent subarachnoid and subdural hemorrhage. Small amount of subdural hemorrhage is seen over the anterior aspect of the left middle cranial fossa. Likely small amount of acute subdural blood over the anterior interhemispheric fissure as well as tiny acute subdural hematoma over the left frontal parietal region best seen on the coronal views measuring 3 mm in thickness. Vascular: No hyperdense vessel or unexpected calcification. Skull: Nondisplaced left occipital bone fracture extending to the skull base involving the left lateral aspect of the foramen magnum. Sinuses/Orbits: No acute finding. Other: Minimal soft tissue swelling over the left occipital scalp. CT CERVICAL SPINE FINDINGS Motion artifact is present. Alignment: Normal. Skull base and vertebrae: Vertebral body heights are normal. There is mild spondylosis throughout the cervical spine. Atlantoaxial articulation is within normal. There is uncovertebral joint spurring. Non fusion of the posterior arch of C1. Mild facet arthropathy is present. No significant neural foraminal narrowing. There is a nondisplaced fracture over the left occipital bone extending to the left skull base involving the lateral aspect of the foramen magnum. Soft tissues and spinal canal: No prevertebral fluid or swelling. No visible canal hematoma. Disc levels:  Mild disc space narrowing at the C5-6 and C6-7 levels. Upper chest: Negative. Other: None.  IMPRESSION: 1. Large acute parenchymal hematoma  over the left frontal lobe with moderate mass effect and midline shift to the right of 1 cm. Adjacent small amount of acute subarachnoid and subdural hemorrhage as well as minimal subdural hemorrhage over the anterior aspect of the interhemispheric fissure, anterior aspect of the left temporal lobe as well as over the left frontoparietal region. Mild soft tissue swelling over the left occipital scalp. 2. Nondisplaced left occipital bone fracture extending to the skull base to the lateral aspect of the foramen magnum. 3.  No acute cervical spine injury. 4. Mild spondylosis of the cervical spine with mild disc disease at the C5-6 and C6-7 levels. Critical Value/emergent results were called by telephone at the time of interpretation on 01/22/2019 at 1:54 pm to the [facility] emergency department and given to PA who works with Dr. Johnney Killian, who verbally acknowledged these results. Dr. Johnney Killian is aware of the above findings and is discussing the case with neurosurgery at this time. Electronically Signed   By: Marin Olp M.D.   On: 01/22/2019 13:55   Dg Chest Port 1 View  Result Date: 01/22/2019 CLINICAL DATA:  Altered mental status. The patient fell earlier today. Fever. EXAM: PORTABLE CHEST 1 VIEW COMPARISON:  Chest x-ray dated 01/17/2016 FINDINGS: The heart size and mediastinal contours are within normal limits. Both lungs are clear. The visualized skeletal structures are unremarkable. IMPRESSION: Normal exam. Electronically Signed   By: Lorriane Shire M.D.   On: 01/22/2019 12:57    Procedures Procedures (including critical care time) CRITICAL CARE Performed by: Charlesetta Shanks   Total critical care time:30 minutes  Critical care time was exclusive of separately billable procedures and treating other patients.  Critical care was necessary to treat or prevent imminent or life-threatening deterioration.  Critical care was time spent personally by me on the following activities: development of treatment  plan with patient and/or surrogate as well as nursing, discussions with consultants, evaluation of patient's response to treatment, examination of patient, obtaining history from patient or surrogate, ordering and performing treatments and interventions, ordering and review of laboratory studies, ordering and review of radiographic studies, pulse oximetry and re-evaluation of patient's condition. Medications Ordered in ED Medications   stroke: mapping our early stages of recovery book (has no administration in time range)  acetaminophen (TYLENOL) tablet 650 mg (has no administration in time range)    Or  acetaminophen (TYLENOL) solution 650 mg (has no administration in time range)    Or  acetaminophen (TYLENOL) suppository 650 mg (has no administration in time range)  senna-docusate (Senokot-S) tablet 1 tablet (has no administration in time range)  pantoprazole (PROTONIX) injection 40 mg (has no administration in time range)  0.9 %  sodium chloride infusion (has no administration in time range)  acetaminophen-codeine (TYLENOL #3) 300-30 MG per tablet 1-2 tablet (has no administration in time range)  HYDROmorphone (DILAUDID) injection 0.5 mg (has no administration in time range)  labetalol (NORMODYNE,TRANDATE) injection 20 mg (has no administration in time range)    And  nicardipine (CARDENE) 20mg  in 0.86% saline 271ml IV infusion (0.1 mg/ml) (has no administration in time range)  sodium chloride 0.9 % bolus 500 mL (0 mLs Intravenous Stopped 01/22/19 1513)     Initial Impression / Assessment and Plan / ED Course  I have reviewed the triage vital signs and the nursing notes.  Pertinent labs & imaging results that were available during my care of the patient were reviewed by me and considered in my  medical decision making (see chart for details).  Clinical Course as of Jan 21 1545  Fri Jan 22, 2019  1249 Obtained HX from patient's husband   [MP]  1407 Consult: Dr. Trenton Gammon has been consulted  case reviewed.   [MP]  31 Patient's husband updated.   [MP]  1424 CT Head Wo Contrast [MP]    Clinical Course User Index [MP] Charlesetta Shanks, MD      Patient with traumatic intraparenchymal hemorrhage.  Mental status is very confused.  Patient appears to have right-sided neurologic deficit.  She is protecting her airway.  No secretions in the airway.  He does awaken to verbal stimulus but is confused.  Neurosurgery has been consulted for definitive management.  Final Clinical Impressions(s) / ED Diagnoses   Final diagnoses:  Traumatic hemorrhage of left cerebrum with loss of consciousness of 30 minutes or less, initial encounter [facility])    ED Discharge Orders    None       Charlesetta Shanks, MD 01/22/19 1424    Charlesetta Shanks, MD 01/22/19 1546

## 2019-01-22 NOTE — ED Notes (Signed)
EDP informed about pts SBP being >150. Will keep observing.

## 2019-01-22 NOTE — ED Triage Notes (Signed)
Pt had a unwitnessed fall at home at approx 0130 (per EMS).C-collar in place, Pt A/O to self upon arrival, confusion is new since fall, pt lethargic but is able to rouse & follow some commands.

## 2019-01-22 NOTE — Progress Notes (Signed)
Pt refuses to wear her C collar despite repeated education.  Viona Gilmore, NP notified and said it was OK to leave collar off.

## 2019-01-22 NOTE — ED Notes (Signed)
ED TO INPATIENT HANDOFF REPORT  ED Nurse Name and Phone #: Barbie Banner, RN -[phone]  S Name/Age/Gender Andrea Arnold 58 y.o. female Room/Bed: 021C/021C  Code Status   Code Status: Full Code  Home/SNF/Other Home Patient oriented to: self Is this baseline? No   Triage Complete: Triage complete  Chief Complaint fall altered fever  Triage Note Pt had a unwitnessed fall at home at approx 0130 (per EMS).C-collar in place, Pt A/O to self upon arrival, confusion is new since fall, pt lethargic but is able to rouse & follow some commands.    Allergies Allergies  Allergen Reactions  . Ivp Dye [Iodinated Diagnostic Agents] Nausea Only    Level of Care/Admitting Diagnosis ED Disposition    ED Disposition Condition Comment   Admit  Hospital Area: [facility] [[id number]]  Level of Care: ICU [6]  Diagnosis: ICH (intracerebral hemorrhage) [facility]) [[id number]]  Admitting Physician: Earnie Larsson [phone]  Attending Physician: Earnie Larsson [phone]  Estimated length of stay: 3 - 4 days  Certification:: I certify this patient will need inpatient services for at least 2 midnights  Bed request comments: 4n  PT Class (Do Not Modify): Inpatient [101]  PT Acc Code (Do Not Modify): Private [1]       B Medical/Surgery History Past Medical History:  Diagnosis Date  . 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 Chicken pox   . Complication of anesthesia    disoriented and took a long time to feel normal after anesthesia  . Hyperlipidemia   . Hypertension    Past Surgical History:  Procedure Laterality Date  . ABLATION  2004  . CERVICAL BIOPSY  W/ LOOP ELECTRODE EXCISION  2015  . COLONOSCOPY    . CYSTOSCOPY W/ RETROGRADES Bilateral 08/31/2018   Procedure: CYSTOSCOPY WITH RETROGRADE PYELOGRAM;  Surgeon: Hollice Espy, MD;  Location: [facility] ORS;  Service: Urology;  Laterality: Bilateral;  . CYSTOSCOPY WITH FULGERATION  08/31/2018   Procedure: CYSTOSCOPY WITH FULGERATION;  Surgeon:  Hollice Espy, MD;  Location: [facility] ORS;  Service: Urology;;  . Rebbeca Paul ABLATION    . TRANSURETHRAL RESECTION OF BLADDER TUMOR WITH MITOMYCIN-C N/A 08/31/2018   Procedure: TRANSURETHRAL RESECTION OF BLADDER TUMOR WITH Gemcitabine;  Surgeon: Hollice Espy, MD;  Location: [facility] ORS;  Service: Urology;  Laterality: N/A;     A IV Location/Drains/Wounds Patient Lines/Drains/Airways Status   Active Line/Drains/Airways    Name:   Placement date:   Placement time:   Site:   Days:   Peripheral IV 01/22/19 Right Antecubital   01/22/19    1212    Antecubital   less than 1   Peripheral IV 01/22/19 Left Antecubital   01/22/19    1340    Antecubital   less than 1   Urethral Catheter Temperature probe 14 Fr.   01/22/19    1500    Temperature probe   less than 1          Intake/Output Last 24 hours  Intake/Output Summary (Last 24 hours) at 01/22/2019 1541 Last data filed at 01/22/2019 1513 Gross per 24 hour  Intake 500 ml  Output -  Net 500 ml    Labs/Imaging Results for orders placed or performed during the hospital encounter of 01/22/19 (from the past 48 hour(s))  Comprehensive metabolic panel     Status: Abnormal   Collection Time: 01/22/19  1:06 PM  Result Value Ref Range   Sodium 135 135 - 145 mmol/L   Potassium 3.5 3.5 - 5.1  mmol/L   Chloride 101 98 - 111 mmol/L   CO2 21 (L) 22 - 32 mmol/L   Glucose, Bld 158 (H) 70 - 99 mg/dL   BUN 14 6 - 20 mg/dL   Creatinine, Ser 0.97 0.44 - 1.00 mg/dL   Calcium 9.3 8.9 - 10.3 mg/dL   Total Protein 7.2 6.5 - 8.1 g/dL   Albumin 4.6 3.5 - 5.0 g/dL   AST 27 15 - 41 U/L   ALT 19 0 - 44 U/L   Alkaline Phosphatase 37 (L) 38 - 126 U/L   Total Bilirubin 0.8 0.3 - 1.2 mg/dL   GFR calc non Af Amer >60 >60 mL/min   GFR calc Af Amer >60 >60 mL/min   Anion gap 13 5 - 15    Comment: Performed at [facility] [address], [place], [place] [address]  Ethanol     Status: None   Collection Time: 01/22/19  1:06 PM  Result Value Ref Range    Alcohol, Ethyl (B) <10 <10 mg/dL    Comment: (NOTE) Lowest detectable limit for serum alcohol is 10 mg/dL. For medical purposes only. Performed at [facility], [place] [address] [place], [place] [address]   Salicylate level     Status: None   Collection Time: 01/22/19  1:06 PM  Result Value Ref Range   Salicylate Lvl <6.3 2.8 - 30.0 mg/dL    Comment: Performed at [facility] [address], [place], [place] [address]  Troponin I - Once     Status: None   Collection Time: 01/22/19  1:06 PM  Result Value Ref Range   Troponin I <0.03 <0.03 ng/mL    Comment: Performed at [facility] [address] [place], [place] [address]  Lactic acid, plasma     Status: Abnormal   Collection Time: 01/22/19  1:06 PM  Result Value Ref Range   Lactic Acid, Venous 2.8 (HH) 0.5 - 1.9 mmol/L    Comment: CRITICAL RESULT CALLED TO, READ BACK BY AND VERIFIED WITH: Toni Hoffmeister,P RN @ [phone] 01/22/19 LEONARD,A Performed at [facility], [place] [address], [place], [place] [address]   CBC with Differential     Status: Abnormal   Collection Time: 01/22/19  1:06 PM  Result Value Ref Range   WBC 12.8 (H) 4.0 - 10.5 K/uL   RBC 5.04 3.87 - 5.11 MIL/uL   Hemoglobin 14.2 12.0 - 15.0 g/dL   HCT 43.8 36.0 - 46.0 %   MCV 86.9 80.0 - 100.0 fL   MCH 28.2 26.0 - 34.0 pg   MCHC 32.4 30.0 - 36.0 g/dL   RDW 12.4 11.5 - 15.5 %   Platelets 264 150 - 400 K/uL   nRBC 0.0 0.0 - 0.2 %   Neutrophils Relative % 93 %   Neutro Abs 11.8 (H) 1.7 - 7.7 K/uL   Lymphocytes Relative 4 %   Lymphs Abs 0.5 (L) 0.7 - 4.0 K/uL   Monocytes Relative 3 %   Monocytes Absolute 0.4 0.1 - 1.0 K/uL   Eosinophils Relative 0 %   Eosinophils Absolute 0.0 0.0 - 0.5 K/uL   Basophils Relative 0 %   Basophils Absolute 0.0 0.0 - 0.1 K/uL   Immature Granulocytes 0 %   Abs Immature Granulocytes 0.05 0.00 - 0.07 K/uL    Comment: Performed at [facility] [address], [place],  [address]  Protime-INR     Status: None  Collection Time: 01/22/19  1:06 PM  Result Value Ref Range   Prothrombin Time 14.0 11.4 - 15.2 seconds   INR 1.1 0.8 - 1.2    Comment: (NOTE) INR goal varies based on device and disease states. Performed at [facility], [place] [address], [place], [place] [address]   TSH     Status: None   Collection Time: 01/22/19  1:06 PM  Result Value Ref Range   TSH 0.621 0.350 - 4.500 uIU/mL    Comment: Performed by a 3rd Generation assay with a functional sensitivity of <=0.01 uIU/mL. Performed at [facility], [place] [address], [place], [place] [address]   Ammonia     Status: None   Collection Time: 01/22/19  1:06 PM  Result Value Ref Range   Ammonia 14 9 - 35 umol/L    Comment: Performed at [facility], [place] [address], [place],  [address]  CK     Status: None   Collection Time: 01/22/19  1:06 PM  Result Value Ref Range   Total CK 82 38 - 234 U/L    Comment: Performed at [facility], [place] [address], [place], [place] [address]  POCT I-Stat EG7     Status: Abnormal   Collection Time: 01/22/19  1:30 PM  Result Value Ref Range   pH, Ven 7.389 7.250 - 7.430   pCO2, Ven 39.1 (L) 44.0 - 60.0 mmHg   pO2, Ven 86.0 (H) 32.0 - 45.0 mmHg   Bicarbonate 23.6 20.0 - 28.0 mmol/L   TCO2 25 22 - 32 mmol/L   O2 Saturation 96.0 %   Acid-base deficit 1.0 0.0 - 2.0 mmol/L   Sodium 137 135 - 145 mmol/L   Potassium 3.5 3.5 - 5.1 mmol/L   Calcium, Ion 1.11 (L) 1.15 - 1.40 mmol/L   HCT 42.0 36.0 - 46.0 %   Hemoglobin 14.3 12.0 - 15.0 g/dL   Patient temperature HIDE    Sample type VENOUS    Ct Head Wo Contrast  Result Date: 01/22/2019 CLINICAL DATA:  Unwitnessed fall at home today. EXAM: CT HEAD WITHOUT CONTRAST CT CERVICAL SPINE WITHOUT CONTRAST TECHNIQUE: Multidetector CT imaging of the head and cervical spine was performed following the standard protocol without intravenous contrast. Multiplanar CT image reconstructions of the cervical spine were also generated. COMPARISON:   None. FINDINGS: CT HEAD FINDINGS Brain: Examination demonstrates a large acute parenchymal hematoma over the left frontal lobe measuring approximately 4 x 6.1 cm in transverse in AP dimension. There is adjacent edema with mass effect and midline shift to the right of 1 cm. There is a small amount of adjacent subarachnoid and subdural hemorrhage. Small amount of subdural hemorrhage is seen over the anterior aspect of the left middle cranial fossa. Likely small amount of acute subdural blood over the anterior interhemispheric fissure as well as tiny acute subdural hematoma over the left frontal parietal region best seen on the coronal views measuring 3 mm in thickness. Vascular: No hyperdense vessel or unexpected calcification. Skull: Nondisplaced left occipital bone fracture extending to the skull base involving the left lateral aspect of the foramen magnum. Sinuses/Orbits: No acute finding. Other: Minimal soft tissue swelling over the left occipital scalp. CT CERVICAL SPINE FINDINGS Motion artifact is present. Alignment: Normal. Skull base and vertebrae: Vertebral body heights are normal. There is mild spondylosis throughout the cervical spine. Atlantoaxial articulation is within normal. There is uncovertebral joint spurring. Non fusion of the posterior arch of C1.  Mild facet arthropathy is present. No significant neural foraminal narrowing. There is a nondisplaced fracture over the left occipital bone extending to the left skull base involving the lateral aspect of the foramen magnum. Soft tissues and spinal canal: No prevertebral fluid or swelling. No visible canal hematoma. Disc levels:  Mild disc space narrowing at the C5-6 and C6-7 levels. Upper chest: Negative. Other: None. IMPRESSION: 1. Large acute parenchymal hematoma over the left frontal lobe with moderate mass effect and midline shift to the right of 1 cm. Adjacent small amount of acute subarachnoid and subdural hemorrhage as well as minimal subdural  hemorrhage over the anterior aspect of the interhemispheric fissure, anterior aspect of the left temporal lobe as well as over the left frontoparietal region. Mild soft tissue swelling over the left occipital scalp. 2. Nondisplaced left occipital bone fracture extending to the skull base to the lateral aspect of the foramen magnum. 3.  No acute cervical spine injury. 4. Mild spondylosis of the cervical spine with mild disc disease at the C5-6 and C6-7 levels. Critical Value/emergent results were called by telephone at the time of interpretation on 01/22/2019 at 1:54 pm to the [facility] emergency department and given to PA who works with Dr. Johnney Killian, who verbally acknowledged these results. Dr. Johnney Killian is aware of the above findings and is discussing the case with neurosurgery at this time. Electronically Signed   By: Marin Olp M.D.   On: 01/22/2019 13:55   Ct Cervical Spine Wo Contrast  Result Date: 01/22/2019 CLINICAL DATA:  Unwitnessed fall at home today. EXAM: CT HEAD WITHOUT CONTRAST CT CERVICAL SPINE WITHOUT CONTRAST TECHNIQUE: Multidetector CT imaging of the head and cervical spine was performed following the standard protocol without intravenous contrast. Multiplanar CT image reconstructions of the cervical spine were also generated. COMPARISON:  None. FINDINGS: CT HEAD FINDINGS Brain: Examination demonstrates a large acute parenchymal hematoma over the left frontal lobe measuring approximately 4 x 6.1 cm in transverse in AP dimension. There is adjacent edema with mass effect and midline shift to the right of 1 cm. There is a small amount of adjacent subarachnoid and subdural hemorrhage. Small amount of subdural hemorrhage is seen over the anterior aspect of the left middle cranial fossa. Likely small amount of acute subdural blood over the anterior interhemispheric fissure as well as tiny acute subdural hematoma over the left frontal parietal region best seen on the coronal views measuring 3 mm in  thickness. Vascular: No hyperdense vessel or unexpected calcification. Skull: Nondisplaced left occipital bone fracture extending to the skull base involving the left lateral aspect of the foramen magnum. Sinuses/Orbits: No acute finding. Other: Minimal soft tissue swelling over the left occipital scalp. CT CERVICAL SPINE FINDINGS Motion artifact is present. Alignment: Normal. Skull base and vertebrae: Vertebral body heights are normal. There is mild spondylosis throughout the cervical spine. Atlantoaxial articulation is within normal. There is uncovertebral joint spurring. Non fusion of the posterior arch of C1. Mild facet arthropathy is present. No significant neural foraminal narrowing. There is a nondisplaced fracture over the left occipital bone extending to the left skull base involving the lateral aspect of the foramen magnum. Soft tissues and spinal canal: No prevertebral fluid or swelling. No visible canal hematoma. Disc levels:  Mild disc space narrowing at the C5-6 and C6-7 levels. Upper chest: Negative. Other: None. IMPRESSION: 1. Large acute parenchymal hematoma over the left frontal lobe with moderate mass effect and midline shift to the right of 1 cm. Adjacent small amount  of acute subarachnoid and subdural hemorrhage as well as minimal subdural hemorrhage over the anterior aspect of the interhemispheric fissure, anterior aspect of the left temporal lobe as well as over the left frontoparietal region. Mild soft tissue swelling over the left occipital scalp. 2. Nondisplaced left occipital bone fracture extending to the skull base to the lateral aspect of the foramen magnum. 3.  No acute cervical spine injury. 4. Mild spondylosis of the cervical spine with mild disc disease at the C5-6 and C6-7 levels. Critical Value/emergent results were called by telephone at the time of interpretation on 01/22/2019 at 1:54 pm to the [facility] emergency department and given to PA who works with Dr. Johnney Killian, who  verbally acknowledged these results. Dr. Johnney Killian is aware of the above findings and is discussing the case with neurosurgery at this time. Electronically Signed   By: Marin Olp M.D.   On: 01/22/2019 13:55   Dg Chest Port 1 View  Result Date: 01/22/2019 CLINICAL DATA:  Altered mental status. The patient fell earlier today. Fever. EXAM: PORTABLE CHEST 1 VIEW COMPARISON:  Chest x-ray dated 01/17/2016 FINDINGS: The heart size and mediastinal contours are within normal limits. Both lungs are clear. The visualized skeletal structures are unremarkable. IMPRESSION: Normal exam. Electronically Signed   By: Lorriane Shire M.D.   On: 01/22/2019 12:57    Pending Labs Unresulted Labs (From admission, onward)    Start     Ordered   01/22/19 1440  HIV antibody (Routine Testing)  Once,   R     01/22/19 1443   01/22/19 1239  Culture, blood (routine x 2)  BLOOD CULTURE X 2,   STAT     01/22/19 1239   01/22/19 1238  Lactic acid, plasma  Now then every 2 hours,   STAT     01/22/19 1239   01/22/19 1238  Urinalysis, Routine w reflex microscopic  ONCE - STAT,   STAT     01/22/19 1239   01/22/19 1238  Urine culture  ONCE - STAT,   STAT     01/22/19 1239   01/22/19 1238  Urine rapid drug screen (hosp performed)  ONCE - STAT,   STAT     01/22/19 1239          Vitals/Pain Today's Vitals   01/22/19 1445 01/22/19 1500 01/22/19 1515 01/22/19 1520  BP: (!) 157/84 (!) 147/83 (!) 154/91   Pulse:      Resp: 20 (!) 25 20 (!) 21  Temp:      TempSrc:      SpO2:    98%  PainSc:    Asleep    Isolation Precautions No active isolations  Medications Medications   stroke: mapping our early stages of recovery book (has no administration in time range)  acetaminophen (TYLENOL) tablet 650 mg (has no administration in time range)    Or  acetaminophen (TYLENOL) solution 650 mg (has no administration in time range)    Or  acetaminophen (TYLENOL) suppository 650 mg (has no administration in time range)   senna-docusate (Senokot-S) tablet 1 tablet (has no administration in time range)  pantoprazole (PROTONIX) injection 40 mg (has no administration in time range)  0.9 %  sodium chloride infusion (has no administration in time range)  acetaminophen-codeine (TYLENOL #3) 300-30 MG per tablet 1-2 tablet (has no administration in time range)  HYDROmorphone (DILAUDID) injection 0.5 mg (has no administration in time range)  labetalol (NORMODYNE,TRANDATE) injection 20 mg (has no administration in time range)  And  nicardipine (CARDENE) 20mg  in 0.86% saline 224ml IV infusion (0.1 mg/ml) (has no administration in time range)  sodium chloride 0.9 % bolus 500 mL (0 mLs Intravenous Stopped 01/22/19 1513)    Mobility non-ambulatory Moderate fall risk   Focused Assessments Neuro Assessment Handoff:  Swallow screen pass?  Cardiac Rhythm: Normal sinus rhythm NIH Stroke Scale ( + Modified Stroke Scale Criteria)  Interval: Shift assessment Level of Consciousness (1a.)   : Not alert, but arousable by minor stimulation to obey, answer, or respond LOC Questions (1b. )   +: Answers both questions correctly LOC Commands (1c. )   + : Performs both tasks correctly Best Gaze (2. )  +: Normal Visual (3. )  +: No visual loss Facial Palsy (4. )    : Normal symmetrical movements Motor Arm, Left (5a. )   +: No drift Motor Arm, Right (5b. )   +: Drift Motor Leg, Left (6a. )   +: Some effort against gravity Motor Leg, Right (6b. )   +: No effort against gravity Limb Ataxia (7. ): Absent Sensory (8. )   +: Normal, no sensory loss Best Language (9. )   +: Mild-to-moderate aphasia Dysarthria (10. ): Normal Extinction/Inattention (11.)   +: No Abnormality Modified SS Total  +: 7 Complete NIHSS TOTAL: 8     Neuro Assessment: Exceptions to WDL Neuro Checks:   Shift assessment (01/22/19 1215)  Last Documented NIHSS Modified Score: 7 (01/22/19 1215) Has TPA been given? No If patient is a Neuro Trauma and patient  is going to OR before floor call report to [facility] nurse: [phone] or [phone]     R Recommendations: See Admitting Provider Note  Report given to: 4N29  Additional Notes: Pt is pleasantly confused d/t an unwitnessed fall, husband found her down, she has a bleed & will be keep for observation. Able to rouse, will follow some commands, Rt arm drift, only oriented to self & both pupils equal & reactive to light/Brisk.Marland Kitchen

## 2019-01-22 NOTE — H&P (Signed)
Andrea Arnold is an 58 y.o. female.   Chief Complaint: Left frontal hemorrhage HPI: 58 year old female who suffered a fall from standing height after tripping yesterday evening.  Patient initially stunned.  According to husband she slept and rested fitfully through the night.  This morning upon awakening she was noted to be somewhat confused and unlike her normal self.  She notes mild headache.  No history of seizure.  No history of anticoagulation.  No incontinence.  Past Medical History:  Diagnosis Date  . Cancer ([place]) 2019   Bladder  . Chicken pox   . Complication of anesthesia    disoriented and took a long time to feel normal after anesthesia  . Hyperlipidemia   . Hypertension     Past Surgical History:  Procedure Laterality Date  . ABLATION  2004  . CERVICAL BIOPSY  W/ LOOP ELECTRODE EXCISION  2015  . COLONOSCOPY    . CYSTOSCOPY W/ RETROGRADES Bilateral 08/31/2018   Procedure: CYSTOSCOPY WITH RETROGRADE PYELOGRAM;  Surgeon: Hollice Espy, MD;  Location: [facility] ORS;  Service: Urology;  Laterality: Bilateral;  . CYSTOSCOPY WITH FULGERATION  08/31/2018   Procedure: CYSTOSCOPY WITH FULGERATION;  Surgeon: Hollice Espy, MD;  Location: [facility] ORS;  Service: Urology;;  . Rebbeca Paul ABLATION    . TRANSURETHRAL RESECTION OF BLADDER TUMOR WITH MITOMYCIN-C N/A 08/31/2018   Procedure: TRANSURETHRAL RESECTION OF BLADDER TUMOR WITH Gemcitabine;  Surgeon: Hollice Espy, MD;  Location: [facility] ORS;  Service: Urology;  Laterality: N/A;    Family History  Problem Relation Age of Onset  . Alcohol abuse Sister   . Drug abuse Sister   . Cancer Sister 13       ovarian   . Mental illness Sister   . Hypertension Father   . Hyperlipidemia Mother   . Cancer Maternal Aunt        breast  . Breast cancer Maternal Aunt 60  . Hyperlipidemia Maternal Grandmother   . Heart disease Maternal Grandmother   . Hypertension Maternal Grandmother   . Hyperlipidemia Maternal Grandfather   . Heart  disease Maternal Grandfather   . Hypertension Maternal Grandfather   . Cancer Cousin        breast  . Breast cancer Cousin 13   Social History:  reports that she has quit smoking. Her smoking use included cigarettes. She has a 10.00 pack-year smoking history. She has never used smokeless tobacco. She reports current alcohol use of about 4.0 standard drinks of alcohol per week. She reports that she does not use drugs.  Allergies:  Allergies  Allergen Reactions  . Ivp Dye [Iodinated Diagnostic Agents] Nausea Only    (Not in a hospital admission)   Results for orders placed or performed during the hospital encounter of 01/22/19 (from the past 48 hour(s))  Comprehensive metabolic panel     Status: Abnormal   Collection Time: 01/22/19  1:06 PM  Result Value Ref Range   Sodium 135 135 - 145 mmol/L   Potassium 3.5 3.5 - 5.1 mmol/L   Chloride 101 98 - 111 mmol/L   CO2 21 (L) 22 - 32 mmol/L   Glucose, Bld 158 (H) 70 - 99 mg/dL   BUN 14 6 - 20 mg/dL   Creatinine, Ser 0.97 0.44 - 1.00 mg/dL   Calcium 9.3 8.9 - 10.3 mg/dL   Total Protein 7.2 6.5 - 8.1 g/dL   Albumin 4.6 3.5 - 5.0 g/dL   AST 27 15 - 41 U/L   ALT 19 0 - 44  U/L   Alkaline Phosphatase 37 (L) 38 - 126 U/L   Total Bilirubin 0.8 0.3 - 1.2 mg/dL   GFR calc non Af Amer >60 >60 mL/min   GFR calc Af Amer >60 >60 mL/min   Anion gap 13 5 - 15    Comment: Performed at [facility] [address], [place], [place] [address]  Ethanol     Status: None   Collection Time: 01/22/19  1:06 PM  Result Value Ref Range   Alcohol, Ethyl (B) <10 <10 mg/dL    Comment: (NOTE) Lowest detectable limit for serum alcohol is 10 mg/dL. For medical purposes only. Performed at [facility], [place] [address] [place], [place] [address]   Salicylate level     Status: None   Collection Time: 01/22/19  1:06 PM  Result Value Ref Range   Salicylate Lvl <3.4 2.8 - 30.0 mg/dL    Comment: Performed at [facility] [address]  [place], [place] [address]  Troponin I - Once     Status: None   Collection Time: 01/22/19  1:06 PM  Result Value Ref Range   Troponin I <0.03 <0.03 ng/mL    Comment: Performed at [facility] [address] [place], [place] [address]  Lactic acid, plasma     Status: Abnormal   Collection Time: 01/22/19  1:06 PM  Result Value Ref Range   Lactic Acid, Venous 2.8 (HH) 0.5 - 1.9 mmol/L    Comment: CRITICAL RESULT CALLED TO, READ BACK BY AND VERIFIED WITH: PULLIAM,P RN @ 9211 01/22/19 LEONARD,A Performed at [facility], [place] [address], [place], [place] [address]   CBC with Differential     Status: Abnormal   Collection Time: 01/22/19  1:06 PM  Result Value Ref Range   WBC 12.8 (H) 4.0 - 10.5 K/uL   RBC 5.04 3.87 - 5.11 MIL/uL   Hemoglobin 14.2 12.0 - 15.0 g/dL   HCT 43.8 36.0 - 46.0 %   MCV 86.9 80.0 - 100.0 fL   MCH 28.2 26.0 - 34.0 pg   MCHC 32.4 30.0 - 36.0 g/dL   RDW 12.4 11.5 - 15.5 %   Platelets 264 150 - 400 K/uL   nRBC 0.0 0.0 - 0.2 %   Neutrophils Relative % 93 %   Neutro Abs 11.8 (H) 1.7 - 7.7 K/uL   Lymphocytes Relative 4 %   Lymphs Abs 0.5 (L) 0.7 - 4.0 K/uL   Monocytes Relative 3 %   Monocytes Absolute 0.4 0.1 - 1.0 K/uL   Eosinophils Relative 0 %   Eosinophils Absolute 0.0 0.0 - 0.5 K/uL   Basophils Relative 0 %   Basophils Absolute 0.0 0.0 - 0.1 K/uL   Immature Granulocytes 0 %   Abs Immature Granulocytes 0.05 0.00 - 0.07 K/uL    Comment: Performed at [facility] [address] [place], [place] [address]  Protime-INR     Status: None   Collection Time: 01/22/19  1:06 PM  Result Value Ref Range   Prothrombin Time 14.0 11.4 - 15.2 seconds   INR 1.1 0.8 - 1.2    Comment: (NOTE) INR goal varies based on device and disease states. Performed at [facility], [place] [address], [place], [place] [address]   TSH     Status: None   Collection Time: 01/22/19  1:06 PM  Result Value Ref Range   TSH 0.621 0.350 - 4.500 uIU/mL  Comment: Performed  by a 3rd Generation assay with a functional sensitivity of <=0.01 uIU/mL. Performed at [facility], [place] [address], [place], [place] [address]   Ammonia     Status: None   Collection Time: 01/22/19  1:06 PM  Result Value Ref Range   Ammonia 14 9 - 35 umol/L    Comment: Performed at [facility], [place] [address], [place], [place] [address]  CK     Status: None   Collection Time: 01/22/19  1:06 PM  Result Value Ref Range   Total CK 82 38 - 234 U/L    Comment: Performed at [facility], [place] [address], [place], [place] [address]  POCT I-Stat EG7     Status: Abnormal   Collection Time: 01/22/19  1:30 PM  Result Value Ref Range   pH, Ven 7.389 7.250 - 7.430   pCO2, Ven 39.1 (L) 44.0 - 60.0 mmHg   pO2, Ven 86.0 (H) 32.0 - 45.0 mmHg   Bicarbonate 23.6 20.0 - 28.0 mmol/L   TCO2 25 22 - 32 mmol/L   O2 Saturation 96.0 %   Acid-base deficit 1.0 0.0 - 2.0 mmol/L   Sodium 137 135 - 145 mmol/L   Potassium 3.5 3.5 - 5.1 mmol/L   Calcium, Ion 1.11 (L) 1.15 - 1.40 mmol/L   HCT 42.0 36.0 - 46.0 %   Hemoglobin 14.3 12.0 - 15.0 g/dL   Patient temperature HIDE    Sample type VENOUS    Ct Head Wo Contrast  Result Date: 01/22/2019 CLINICAL DATA:  Unwitnessed fall at home today. EXAM: CT HEAD WITHOUT CONTRAST CT CERVICAL SPINE WITHOUT CONTRAST TECHNIQUE: Multidetector CT imaging of the head and cervical spine was performed following the standard protocol without intravenous contrast. Multiplanar CT image reconstructions of the cervical spine were also generated. COMPARISON:  None. FINDINGS: CT HEAD FINDINGS Brain: Examination demonstrates a large acute parenchymal hematoma over the left frontal lobe measuring approximately 4 x 6.1 cm in transverse in AP dimension. There is adjacent edema with mass effect and midline shift to the right of 1 cm. There is a small amount of adjacent subarachnoid and subdural hemorrhage. Small amount of subdural hemorrhage is seen over the anterior aspect of the  left middle cranial fossa. Likely small amount of acute subdural blood over the anterior interhemispheric fissure as well as tiny acute subdural hematoma over the left frontal parietal region best seen on the coronal views measuring 3 mm in thickness. Vascular: No hyperdense vessel or unexpected calcification. Skull: Nondisplaced left occipital bone fracture extending to the skull base involving the left lateral aspect of the foramen magnum. Sinuses/Orbits: No acute finding. Other: Minimal soft tissue swelling over the left occipital scalp. CT CERVICAL SPINE FINDINGS Motion artifact is present. Alignment: Normal. Skull base and vertebrae: Vertebral body heights are normal. There is mild spondylosis throughout the cervical spine. Atlantoaxial articulation is within normal. There is uncovertebral joint spurring. Non fusion of the posterior arch of C1. Mild facet arthropathy is present. No significant neural foraminal narrowing. There is a nondisplaced fracture over the left occipital bone extending to the left skull base involving the lateral aspect of the foramen magnum. Soft tissues and spinal canal: No prevertebral fluid or swelling. No visible canal hematoma. Disc levels:  Mild disc space narrowing at the C5-6 and C6-7 levels. Upper chest: Negative. Other: None. IMPRESSION: 1. Large acute parenchymal hematoma over the left frontal lobe with moderate mass effect and midline shift to the  right of 1 cm. Adjacent small amount of acute subarachnoid and subdural hemorrhage as well as minimal subdural hemorrhage over the anterior aspect of the interhemispheric fissure, anterior aspect of the left temporal lobe as well as over the left frontoparietal region. Mild soft tissue swelling over the left occipital scalp. 2. Nondisplaced left occipital bone fracture extending to the skull base to the lateral aspect of the foramen magnum. 3.  No acute cervical spine injury. 4. Mild spondylosis of the cervical spine with mild disc  disease at the C5-6 and C6-7 levels. Critical Value/emergent results were called by telephone at the time of interpretation on 01/22/2019 at 1:54 pm to the [facility] emergency department and given to PA who works with Dr. Johnney Killian, who verbally acknowledged these results. Dr. Johnney Killian is aware of the above findings and is discussing the case with neurosurgery at this time. Electronically Signed   By: Marin Olp M.D.   On: 01/22/2019 13:55   Ct Cervical Spine Wo Contrast  Result Date: 01/22/2019 CLINICAL DATA:  Unwitnessed fall at home today. EXAM: CT HEAD WITHOUT CONTRAST CT CERVICAL SPINE WITHOUT CONTRAST TECHNIQUE: Multidetector CT imaging of the head and cervical spine was performed following the standard protocol without intravenous contrast. Multiplanar CT image reconstructions of the cervical spine were also generated. COMPARISON:  None. FINDINGS: CT HEAD FINDINGS Brain: Examination demonstrates a large acute parenchymal hematoma over the left frontal lobe measuring approximately 4 x 6.1 cm in transverse in AP dimension. There is adjacent edema with mass effect and midline shift to the right of 1 cm. There is a small amount of adjacent subarachnoid and subdural hemorrhage. Small amount of subdural hemorrhage is seen over the anterior aspect of the left middle cranial fossa. Likely small amount of acute subdural blood over the anterior interhemispheric fissure as well as tiny acute subdural hematoma over the left frontal parietal region best seen on the coronal views measuring 3 mm in thickness. Vascular: No hyperdense vessel or unexpected calcification. Skull: Nondisplaced left occipital bone fracture extending to the skull base involving the left lateral aspect of the foramen magnum. Sinuses/Orbits: No acute finding. Other: Minimal soft tissue swelling over the left occipital scalp. CT CERVICAL SPINE FINDINGS Motion artifact is present. Alignment: Normal. Skull base and vertebrae: Vertebral body heights  are normal. There is mild spondylosis throughout the cervical spine. Atlantoaxial articulation is within normal. There is uncovertebral joint spurring. Non fusion of the posterior arch of C1. Mild facet arthropathy is present. No significant neural foraminal narrowing. There is a nondisplaced fracture over the left occipital bone extending to the left skull base involving the lateral aspect of the foramen magnum. Soft tissues and spinal canal: No prevertebral fluid or swelling. No visible canal hematoma. Disc levels:  Mild disc space narrowing at the C5-6 and C6-7 levels. Upper chest: Negative. Other: None. IMPRESSION: 1. Large acute parenchymal hematoma over the left frontal lobe with moderate mass effect and midline shift to the right of 1 cm. Adjacent small amount of acute subarachnoid and subdural hemorrhage as well as minimal subdural hemorrhage over the anterior aspect of the interhemispheric fissure, anterior aspect of the left temporal lobe as well as over the left frontoparietal region. Mild soft tissue swelling over the left occipital scalp. 2. Nondisplaced left occipital bone fracture extending to the skull base to the lateral aspect of the foramen magnum. 3.  No acute cervical spine injury. 4. Mild spondylosis of the cervical spine with mild disc disease at the C5-6 and C6-7  levels. Critical Value/emergent results were called by telephone at the time of interpretation on 01/22/2019 at 1:54 pm to the [facility] emergency department and given to PA who works with Dr. Johnney Killian, who verbally acknowledged these results. Dr. Johnney Killian is aware of the above findings and is discussing the case with neurosurgery at this time. Electronically Signed   By: Marin Olp M.D.   On: 01/22/2019 13:55   Dg Chest Port 1 View  Result Date: 01/22/2019 CLINICAL DATA:  Altered mental status. The patient fell earlier today. Fever. EXAM: PORTABLE CHEST 1 VIEW COMPARISON:  Chest x-ray dated 01/17/2016 FINDINGS: The heart size  and mediastinal contours are within normal limits. Both lungs are clear. The visualized skeletal structures are unremarkable. IMPRESSION: Normal exam. Electronically Signed   By: Lorriane Shire M.D.   On: 01/22/2019 12:57    Pertinent items noted in HPI and remainder of comprehensive ROS otherwise negative.  Blood pressure (!) 146/93, pulse 83, temperature 99 F (37.2 C), temperature source Oral, resp. rate (!) 24, SpO2 100 %.  The patient is mildly somnolent.  She awakens easily.  She is oriented x3.  Her speech is slow but reasonably fluent.  Cranial nerve function is normal bilaterally-pupils are equal at 4 mm bilaterally and briskly reactive.  Motor examination with some mild right-sided weakness grading out at 4/5 with the right upper extremity and 4+/5 with the right lower extremity.  Sensory examination nonfocal.  Examination head ears eyes nose and throat demonstrates evidence of some right posterior scalp swelling.  No evidence of bony abnormality.  Neck nontender.  Range of motion full.  Extremities free from injury or deformity.  Chest and abdomen benign. Assessment/Plan Status post fall with large left frontal contrecoup hemorrhagic contusion.  Although the blood clot is of significant size she has rather mild mass-effect.  Her basilar cisterns are not effaced.  Her neurologic exam is good.  I discussed situation with the patient and her husband.  I have recommended ICU observation with follow-up head CT scan in morning.  Should her symptoms worsen then I would move forward with emergent evacuation of her hematoma but as of now I do not think it is indicated.  Cooper Render Sotiria Keast 01/22/2019, 2:34 PM

## 2019-01-22 NOTE — ED Notes (Signed)
Neuro Surgeon at bedside & was given the number to the pts husband.

## 2019-01-23 ENCOUNTER — Inpatient Hospital Stay (HOSPITAL_COMMUNITY): Payer: BLUE CROSS/BLUE SHIELD

## 2019-01-23 LAB — HIV ANTIBODY (ROUTINE TESTING W REFLEX): HIV Screen 4th Generation wRfx: NONREACTIVE

## 2019-01-23 LAB — URINE CULTURE: Culture: NO GROWTH

## 2019-01-23 NOTE — Evaluation (Signed)
Occupational Therapy Evaluation Patient Details Name: Andrea Arnold MRN: [id number] DOB: [birth year] Today's Date: 01/23/2019    History of Present Illness 58 year old female admitted 01/22/2019 after tripping and falling from standing height, confused and unlike her baseline the next morning with mild headache. PMH: bladder cancer, HLD, HTN. Head CT = large left frontal intraparenchymal hematoma with 76mm rightward midline shift.    Clinical Impression   Pt reporting she lives with her boyfriend and was independent PTA. However, pt also reporting she lives in [facility] and she must be in a hospital in [place]. Pt currently requiring Min Guard A for UB ADLs, Min-Mod A for LB ADLs, and Min-Mod A for functional mobility. Pt presenting with right sided weakness, decreased cognition, poor balance, and dizziness. VSS throughout. Pt would benefit from further acute OT to facilitate safe dc. Recommend dc to CIR for intensive OT to optimize safety, independence with ADLs, and return to PLOF.      Follow Up Recommendations  CIR;Supervision/Assistance - 24 hour    Equipment Recommendations  Other (comment)(Defer to next venue)    Recommendations for Other Services Rehab consult;Speech consult;PT consult     Precautions / Restrictions Precautions Precautions: Fall      Mobility Bed Mobility Overal bed mobility: Needs Assistance Bed Mobility: Supine to Sit     Supine to sit: Min assist     General bed mobility comments: min assist for safety and line management with cues to direct.   Transfers Overall transfer level: Needs assistance   Transfers: Sit to/from Stand Sit to Stand: Min assist         General transfer comment: Min assist for power up to standing with noted LOB posteriorly requiring righting assist. Cues for hand placement to control descent to chair and positoning    Balance Overall balance assessment: Needs assistance;History of Falls Sitting-balance support:  Feet supported Sitting balance-Leahy Scale: Fair Sitting balance - Comments: able to self support and perfrom some modest dynamic tasks   Standing balance support: During functional activity Standing balance-Leahy Scale: Poor Standing balance comment: requires intermittent hands on assist for stability during upright tasks              High level balance activites: Turns;Direction changes High Level Balance Comments: increased physical assist            ADL either performed or assessed with clinical judgement   ADL Overall ADL's : Needs assistance/impaired Eating/Feeding: Set up;Supervision/ safety;Sitting   Grooming: Oral care;Min guard;Standing Grooming Details (indicate cue type and reason): Pt requiring close Min guard A while standing at sink to complete oral care. Pt presenting with poor FM skills, problem solving, and awareness.  Upper Body Bathing: Min guard;Sitting   Lower Body Bathing: Minimal assistance;Sit to/from stand;Moderate assistance   Upper Body Dressing : Min guard;Sitting   Lower Body Dressing: Minimal assistance;Sit to/from stand;Moderate assistance Lower Body Dressing Details (indicate cue type and reason): Pt able to don socks while sitting at EOB bringing ankles to knees. Min-Mod A for standing balance Toilet Transfer: Minimal assistance;Moderate assistance;Ambulation(simulated to recliner)           Functional mobility during ADLs: Minimal assistance;Moderate assistance General ADL Comments: Pt with decreased balance, strength, functional use of RUE, and activity tolerance.      Vision         Perception     Praxis      Pertinent Vitals/Pain Pain Assessment: 0-10 Pain Score: 3  Pain Location: head  Pain Descriptors / Indicators: Headache Pain Intervention(s): Monitored during session     Hand Dominance Right   Extremity/Trunk Assessment Upper Extremity Assessment Upper Extremity Assessment: RUE deficits/detail RUE Deficits /  Details: Weakness, poor grasp strength, decreased FM RUE Coordination: decreased fine motor;decreased gross motor   Lower Extremity Assessment Lower Extremity Assessment: Defer to PT evaluation RLE Deficits / Details: noted RLE assymetrical weakness gross motions with evidence of RLE drag during mobility and decreased dorsiflexion RLE Coordination: decreased gross motor;decreased fine motor   Cervical / Trunk Assessment Cervical / Trunk Assessment: Normal   Communication     Cognition Arousal/Alertness: Awake/alert Behavior During Therapy: WFL for tasks assessed/performed Overall Cognitive Status: Impaired/Different from baseline Area of Impairment: Orientation;Attention;Memory;Following commands;Awareness;Problem solving                 Orientation Level: Disoriented to;Place Current Attention Level: Sustained Memory: Decreased short-term memory Following Commands: Follows one step commands with increased time   Awareness: Emergent Problem Solving: Slow processing;Requires verbal cues;Requires tactile cues General Comments: Pt reporting she lives and is current in [facility]. patient asked who she lives with and she said that she lives with "devon" as in the name of this therapist, when asked again, patient reponded with "warren"   General Comments  VSS throughout    Exercises     Shoulder Instructions      Home Living Family/patient expects to be discharged to:: Private residence Living Arrangements: Spouse/significant other Available Help at Discharge: Family Type of Home: House                           Additional Comments: patient appears to be poor historian at this time, inconsistent responses to home living situation  Lives With: Spouse    Prior Functioning/Environment Level of Independence: Independent                 OT Problem List: Decreased strength;Decreased range of motion;Decreased activity tolerance;Impaired balance (sitting  and/or standing);Decreased knowledge of use of DME or AE;Decreased knowledge of precautions;Decreased safety awareness;Decreased cognition;Impaired UE functional use      OT Treatment/Interventions: Self-care/ADL training;Therapeutic exercise;Energy conservation;DME and/or AE instruction;Therapeutic activities;Patient/family education    OT Goals(Current goals can be found in the care plan section) Acute Rehab OT Goals Patient Stated Goal: to get better OT Goal Formulation: With patient Time For Goal Achievement: 02/06/19 Potential to Achieve Goals: Good  OT Frequency: Min 3X/week   Barriers to D/C:            Co-evaluation PT/OT/SLP Co-Evaluation/Treatment: Yes Reason for Co-Treatment: Complexity of the patient's impairments (multi-system involvement);For patient/therapist safety;To address functional/ADL transfers PT goals addressed during session: Mobility/safety with mobility;Balance OT goals addressed during session: ADL's and self-care      AM-PAC OT "6 Clicks" Daily Activity     Outcome Measure Help from another person eating meals?: A Little Help from another person taking care of personal grooming?: A Little Help from another person toileting, which includes using toliet, bedpan, or urinal?: A Little Help from another person bathing (including washing, rinsing, drying)?: A Lot Help from another person to put on and taking off regular upper body clothing?: A Little Help from another person to put on and taking off regular lower body clothing?: A Lot 6 Click Score: 16   End of Session Equipment Utilized During Treatment: Gait belt Nurse Communication: Mobility status  Activity Tolerance: Patient tolerated treatment well Patient left: in chair;with call bell/phone within  reach;with chair alarm set  OT Visit Diagnosis: Unsteadiness on feet (R26.81);Other abnormalities of gait and mobility (R26.89);Muscle weakness (generalized) (M62.81);Other symptoms and signs involving  cognitive function;Hemiplegia and hemiparesis Hemiplegia - Right/Left: Right Hemiplegia - dominant/non-dominant: Dominant Hemiplegia - caused by: Cerebral infarction                Time: 4665-9935 OT Time Calculation (min): 17 min Charges:  OT General Charges $OT Visit: 1 Visit OT Evaluation $OT Eval Moderate Complexity: 1 Mod  Crockett Rallo MSOT, OTR/L Acute Rehab Pager: [phone] Office: [place] 01/23/2019, 4:26 PM

## 2019-01-23 NOTE — Progress Notes (Signed)
PT Cancellation Note  Patient Details Name: Andrea Arnold MRN: [id number] DOB: [birth year]   Cancelled Treatment:    Reason Eval/Treat Not Completed: Patient not medically ready;Active bedrest order   Duncan Dull 01/23/2019, 7:09 AM Alben Deeds, PT DPT  Board Certified Neurologic Specialist Acute Rehabilitation Services Pager [phone] Office [phone]

## 2019-01-23 NOTE — Progress Notes (Signed)
SBP <160 per Dr. Annette Stable

## 2019-01-23 NOTE — Evaluation (Signed)
Speech Language Pathology Evaluation Patient Details Name: Andrea Arnold MRN: [id number] DOB: Apr 05, 1961 Today's Date: 01/23/2019 Time: 1220-1300 SLP Time Calculation (min) (ACUTE ONLY): 40 min  Problem List:  Patient Active Problem List   Diagnosis Date Noted  . ICH (intracerebral hemorrhage) ([place]) 01/22/2019  . Postmenopausal 07/13/2018  . LGSIL (low grade squamous intraepithelial dysplasia) 01/22/2017  . Hematuria, microscopic 01/06/2017  . Dysuria 12/31/2016  . Pure hypercholesterolemia 12/27/2016  . Vitamin D deficiency 12/27/2016  . Abnormal CXR 01/25/2016  . Chronic fatigue 01/25/2016  . Cough 01/17/2016  . Routine general medical examination at health care facility 11/01/2013  . Hyperlipidemia LDL goal < 100 09/23/2012  . Abnormal EKG 09/23/2012   Past Medical History:  Past Medical History:  Diagnosis Date  . Cancer ([place]) 2019   Bladder  . Chicken pox   . Complication of anesthesia    disoriented and took a long time to feel normal after anesthesia  . Hyperlipidemia   . Hypertension    Past Surgical History:  Past Surgical History:  Procedure Laterality Date  . ABLATION  2004  . CERVICAL BIOPSY  W/ LOOP ELECTRODE EXCISION  2015  . COLONOSCOPY    . CYSTOSCOPY W/ RETROGRADES Bilateral 08/31/2018   Procedure: CYSTOSCOPY WITH RETROGRADE PYELOGRAM;  Surgeon: Hollice Espy, MD;  Location: [facility] ORS;  Service: Urology;  Laterality: Bilateral;  . CYSTOSCOPY WITH FULGERATION  08/31/2018   Procedure: CYSTOSCOPY WITH FULGERATION;  Surgeon: Hollice Espy, MD;  Location: [facility] ORS;  Service: Urology;;  . Rebbeca Paul ABLATION    . TRANSURETHRAL RESECTION OF BLADDER TUMOR WITH MITOMYCIN-C N/A 08/31/2018   Procedure: TRANSURETHRAL RESECTION OF BLADDER TUMOR WITH Gemcitabine;  Surgeon: Hollice Espy, MD;  Location: [facility] ORS;  Service: Urology;  Laterality: N/A;   HPI:  58 year old female admitted 01/22/2019 after tripping and falling from standing height, confused and  unlike her baseline the next morning with mild headache. PMH: bladder cancer, HLD, HTN. Head CT = large left frontal intraparenchymal hematoma with 3mm rightward midline shift.   Assessment / Plan / Recommendation Clinical Impression  Pt was seen at bedside to assess cognitive linguistic function. Low vocal intensity noted, which pt reports is not her baseline. Pt was able to tell me the day and date, but had difficulty repeating 5 words correctly despite multiple presentations. Pt also had difficulty with simple mental math question, raising concern for language impairment. Pt was able to answer simple yes/no questions accurately, but was 80% correct with complex yes/no questions. She was able to name all the food and drink items on her lunch tray, but was 80% accuracy with responsive naming tasks. Mental flexibility was noted to be impaired, with pt naming 6 animals, then named banana and garden, then named one more animal. Pt was unable to provide multiple definitions of the homonym "plant", with semantic paraphasias and other general "word salad" noted. ST will continue to follow pt for treatment of higher level language skills. RN encouraged to keep directions and questions simple, given word retrieval deficits.    SLP Assessment  SLP Recommendation/Assessment: Patient needs continued Speech Language Pathology Services SLP Visit Diagnosis: Cognitive communication deficit (R41.841);Aphasia (R47.01)    Follow Up Recommendations  (TBD)    Frequency and Duration min 1 x/week  2 weeks      SLP Evaluation Cognition  Overall Cognitive Status: Within Functional Limits for tasks assessed Arousal/Alertness: Awake/alert Orientation Level: Oriented X4       Comprehension  Auditory Comprehension Overall Auditory Comprehension: Impaired  Yes/No Questions: Impaired Basic Immediate Environment Questions: 75-100% accurate Complex Questions: (80% accuracy) Commands: Within Functional Limits     Expression Expression Primary Mode of Expression: Verbal Verbal Expression Overall Verbal Expression: Impaired Initiation: No impairment Automatic Speech: Name;Social Response;Counting;Day of week;Month of year Level of Generative/Spontaneous Verbalization: Phrase;Word Repetition: No impairment Naming: Impairment Responsive: (80%) Confrontation: Within functional limits Convergent: 50-74% accurate Divergent: 50-74% accurate Verbal Errors: Language of confusion;Not aware of errors Pragmatics: No impairment Written Expression Dominant Hand: Right   Oral / Motor  Oral Motor/Sensory Function Overall Oral Motor/Sensory Function: Within functional limits Motor Speech Overall Motor Speech: Appears within functional limits for tasks assessed   GO                    B. Quentin [facility], CCC-SLP Speech Language Pathologist [phone]  Shonna Chock 01/23/2019, 1:04 PM

## 2019-01-23 NOTE — Evaluation (Addendum)
Physical Therapy Evaluation Patient Details Name: Andrea Arnold MRN: [id number] DOB: 1960/12/07 Today's Date: 01/23/2019   History of Present Illness  58 year old female admitted 01/22/2019 after tripping and falling from standing height, confused and unlike her baseline the next morning with mild headache. PMH: bladder cancer, HLD, HTN. Head CT = large left frontal intraparenchymal hematoma with 67mm rightward midline shift.   Clinical Impression  Orders received for PT evaluation. Patient demonstrates deficits in functional mobility as indicated below. Will benefit from continued skilled PT to address deficits and maximize function. Will see as indicated and progress as tolerated.  Prior to admission, patient presumed highly independent. Currently, patient with noted impairments related to strength and cognition impacting ability to function safely. Given current functional status feel patient would benefit significantly from short term comprehensive inpatient therapies to maximize potential for recovery and independence.     Follow Up Recommendations CIR    Equipment Recommendations  None recommended by PT    Recommendations for Other Services Rehab consult     Precautions / Restrictions Precautions Precautions: Fall      Mobility  Bed Mobility Overal bed mobility: Needs Assistance Bed Mobility: Supine to Sit     Supine to sit: Min assist     General bed mobility comments: min assist for safety and line management with cues to direct.   Transfers Overall transfer level: Needs assistance   Transfers: Sit to/from Stand Sit to Stand: Min assist         General transfer comment: Min assist for power up to standing with noted LOB posteriorly requiring righting assist. Cues for hand placement to control descent to chair and positoning  Ambulation/Gait Ambulation/Gait assistance: Min assist;Mod assist Gait Distance (Feet): 34 Feet Assistive device: 1 person hand held  assist Gait Pattern/deviations: Step-through pattern;Decreased stride length;Decreased dorsiflexion - right;Staggering right;Drifts right/left Gait velocity: decreased   General Gait Details: noted instability with ambulation. several LOB during short distance requiring hands on assist at all times. Attempted to release assist and patient with poor ability to self correct, cathcing RLE during initiation of swing through impeding overall balance. + dizziness with ambulation attempt  Stairs            Wheelchair Mobility    Modified Rankin (Stroke Patients Only) Modified Rankin (Stroke Patients Only) Pre-Morbid Rankin Score: No symptoms Modified Rankin: Moderately severe disability     Balance Overall balance assessment: Needs assistance;History of Falls Sitting-balance support: Feet supported Sitting balance-Leahy Scale: Fair Sitting balance - Comments: able to self support and perfrom some modest dynamic tasks   Standing balance support: During functional activity Standing balance-Leahy Scale: Poor Standing balance comment: requires intermittent hands on assist for stability during upright tasks              High level balance activites: Turns;Direction changes High Level Balance Comments: increased physical assist              Pertinent Vitals/Pain Pain Assessment: 0-10 Pain Score: 3  Pain Location: head Pain Descriptors / Indicators: Headache Pain Intervention(s): Monitored during session    Home Living Family/patient expects to be discharged to:: Private residence Living Arrangements: Spouse/significant other Available Help at Discharge: Family Type of Home: House           Additional Comments: patient appears to be poor historian at this time, inconsistent responses to home living situation    Prior Function Level of Independence: Independent  Hand Dominance   Dominant Hand: Right    Extremity/Trunk Assessment   Upper  Extremity Assessment Upper Extremity Assessment: RUE deficits/detail    Lower Extremity Assessment Lower Extremity Assessment: RLE deficits/detail RLE Deficits / Details: noted RLE assymetrical weakness gross motions with evidence of RLE drag during mobility and decreased dorsiflexion RLE Coordination: decreased gross motor;decreased fine motor       Communication      Cognition Arousal/Alertness: Awake/alert Behavior During Therapy: WFL for tasks assessed/performed Overall Cognitive Status: Impaired/Different from baseline Area of Impairment: Orientation;Attention;Memory;Following commands;Awareness;Problem solving                 Orientation Level: Disoriented to;Place Current Attention Level: Sustained Memory: Decreased short-term memory Following Commands: Follows one step commands with increased time   Awareness: Emergent Problem Solving: Slow processing;Requires verbal cues;Requires tactile cues General Comments: patient asked who she lives with and she said that she lives with "Fransico Sciandra" as in the name of this therapist, when asked again, patient reponded with "warren"      General Comments      Exercises     Assessment/Plan    PT Assessment Patient needs continued PT services  PT Problem List Decreased strength;Decreased activity tolerance;Decreased balance;Decreased mobility;Decreased coordination;Decreased cognition;Decreased safety awareness;Cardiopulmonary status limiting activity;Pain       PT Treatment Interventions DME instruction;Gait training;Stair training;Functional mobility training;Therapeutic activities;Therapeutic exercise;Balance training;Neuromuscular re-education;Cognitive remediation;Patient/family education    PT Goals (Current goals can be found in the Care Plan section)  Acute Rehab PT Goals Patient Stated Goal: to get better PT Goal Formulation: With patient Time For Goal Achievement: 02/06/19 Potential to Achieve Goals: Good     Frequency Min 3X/week   Barriers to discharge        Co-evaluation PT/OT/SLP Co-Evaluation/Treatment: Yes Reason for Co-Treatment: Necessary to address cognition/behavior during functional activity;For patient/therapist safety;To address functional/ADL transfers PT goals addressed during session: Mobility/safety with mobility;Balance OT goals addressed during session: ADL's and self-care       AM-PAC PT "6 Clicks" Mobility  Outcome Measure Help needed turning from your back to your side while in a flat bed without using bedrails?: A Little Help needed moving from lying on your back to sitting on the side of a flat bed without using bedrails?: A Little Help needed moving to and from a bed to a chair (including a wheelchair)?: A Little Help needed standing up from a chair using your arms (e.g., wheelchair or bedside chair)?: A Little Help needed to walk in hospital room?: A Little Help needed climbing 3-5 steps with a railing? : A Lot 6 Click Score: 17    End of Session Equipment Utilized During Treatment: Gait belt Activity Tolerance: Patient tolerated treatment well Patient left: in chair;with call bell/phone within reach;with chair alarm set Nurse Communication: Mobility status PT Visit Diagnosis: Unsteadiness on feet (R26.81);Other symptoms and signs involving the nervous system (R29.898);Difficulty in walking, not elsewhere classified (R26.2)    Time: 1552-0802 PT Time Calculation (min) (ACUTE ONLY): 18 min   Charges:   PT Evaluation $PT Eval Moderate Complexity: 1 Mod          Alben Deeds, PT DPT  Board Certified Neurologic Specialist Acute Rehabilitation Services Pager [phone] Office [phone]   Duncan Dull 01/23/2019, 2:51 PM

## 2019-01-23 NOTE — Progress Notes (Signed)
OT Cancellation Note  Patient Details Name: Andrea Arnold MRN: [id number] DOB: 29-Jul-1961   Cancelled Treatment:    Reason Eval/Treat Not Completed: Active bedrest order(Will return as schedule allows. Thank you!)  [place], OTR/L Acute Rehab Pager: [phone] Office: [phone] 01/23/2019, 7:08 AM

## 2019-01-23 NOTE — Progress Notes (Signed)
Patient looks much better this morning.  She is much brighter and more animated.  She complains of mild headache.  She has had no further nausea or vomiting.  She is afebrile.  She is mildly hypertensive.  Heart rate normal.  Awake and alert.  Oriented and reasonably appropriate.  Cranial nerve function normal bilateral.  Motor examination with a very slight right-sided drift.  Chest and abdomen benign.  Follow-up head CT scan this morning demonstrates stable appearance for left sided intracerebral hematoma.  No evidence of significant worsening edema.  Midline shift minimal.  Basilar cisterns remain open.  Overall I am encouraged by the patient's neurologic improvement and her stable CT scan.  I think she may be safely started on a diet and gently mobilized.  Continue ICU observation.  Follow-up head CT scan Monday morning.

## 2019-01-24 NOTE — Progress Notes (Signed)
Occupational Therapy Treatment Patient Details Name: Chanley Mcenery MRN: [id number] DOB: [birth year] Today's Date: 01/24/2019    History of present illness 58 year old female admitted 01/22/2019 after tripping and falling from standing height, confused and unlike her baseline the next morning with mild headache. PMH: bladder cancer, HLD, HTN. Head CT = large left frontal intraparenchymal hematoma with 8mm rightward midline shift.    OT comments  This 58 yo female admitted with above presents to acute OT making progress with basic mobility and self care as well --currently overall at a min A level with pt dragging LLE with ambulation requiring VCs to pick foot up to walk. She is also oriented today more so than yesterday. She will continue to benefit from acute OT with follow up OT on CIR.    Follow Up Recommendations  CIR;Supervision/Assistance - 24 hour    Equipment Recommendations  Other (comment)(TBD on next venue)       Precautions / Restrictions Precautions Precautions: Fall Restrictions Weight Bearing Restrictions: No       Mobility Bed Mobility Overal bed mobility: Needs Assistance Bed Mobility: Supine to Sit     Supine to sit: Supervision;HOB elevated        Transfers Overall transfer level: Needs assistance   Transfers: Sit to/from Stand Sit to Stand: Min guard         General transfer comment: Min A for ambulation without AD    Balance Overall balance assessment: Needs assistance;History of Falls Sitting-balance support: Feet supported;No upper extremity supported Sitting balance-Leahy Scale: Good     Standing balance support: No upper extremity supported Standing balance-Leahy Scale: Fair Standing balance comment: standing to brush teeth                           ADL either performed or assessed with clinical judgement   ADL Overall ADL's : Needs assistance/impaired     Grooming: Oral care;Min guard;Standing Grooming Details  (indicate cue type and reason): manipulating toothbrush and toothpaste normally today                 Toilet Transfer: Minimal assistance Toilet Transfer Details (indicate cue type and reason): bed>ambulated 100 feet in hallways>bathroom to brush teeth, sit in recliner                 Vision Patient Visual Report: No change from baseline            Cognition Arousal/Alertness: Awake/alert Behavior During Therapy: Flat affect Overall Cognitive Status: Impaired/Different from baseline Area of Impairment: Orientation;Attention;Following commands;Safety/judgement;Awareness;Problem solving                 Orientation Level: Place;Time(could state hospital but not which one; did not know day of week (did know year and month); knew she had fallen but not anything other than this) Current Attention Level: Sustained   Following Commands: Follows one step commands consistently Safety/Judgement: Decreased awareness of safety;Decreased awareness of deficits Awareness: Emergent Problem Solving: Slow processing;Requires verbal cues General Comments: 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 She does report she does not feel like she is thinking at her baseline.                   Pertinent Vitals/ Pain       Pain Assessment: 0-10 Pain Score: 2  Pain Location: head--front Pain Descriptors / Indicators: Headache Pain Intervention(s): Monitored  during session         Frequency  Min 3X/week        Progress Toward Goals  OT Goals(current goals can now be found in the care plan section)  Progress towards OT goals: Progressing toward goals     Plan Discharge plan remains appropriate       AM-PAC OT "6 Clicks" Daily Activity     Outcome Measure   Help from another person eating meals?: None Help from another person taking care of personal grooming?: A Little Help from another person toileting, which  includes using toliet, bedpan, or urinal?: A Little Help from another person bathing (including washing, rinsing, drying)?: A Little Help from another person to put on and taking off regular upper body clothing?: A Little Help from another person to put on and taking off regular lower body clothing?: A Little 6 Click Score: 19    End of Session Equipment Utilized During Treatment: Gait belt  OT Visit Diagnosis: Unsteadiness on feet (R26.81);Other abnormalities of gait and mobility (R26.89);Muscle weakness (generalized) (M62.81);Other symptoms and signs involving cognitive function;Hemiplegia and hemiparesis Hemiplegia - Right/Left: Right Hemiplegia - dominant/non-dominant: Dominant Hemiplegia - caused by: (bleed)   Activity Tolerance Patient tolerated treatment well   Patient Left in chair;with call bell/phone within reach;with chair alarm set   Nurse Communication Mobility status        Time: 7517-0017 OT Time Calculation (min): 20 min  Charges: OT General Charges $OT Visit: 1 Visit OT Treatments $Self Care/Home Management : 8-22 mins  [place], OTR/L Acute Rehab Services Pager [phone] Office [phone]      Almon Register 01/24/2019, 1:51 PM

## 2019-01-24 NOTE — Progress Notes (Signed)
Overall stable.  Minimal headache.  Patient wide awake.  She is oriented and appropriate.  Her speech is fluent.  Her judgment and insight appear intact.  Her strength is equal bilaterally.  Overall progressing well.  Check follow-up head CT scan in morning.  If stable patient may be transferred to floor and hopefully will be able to go home soon.

## 2019-01-24 NOTE — Progress Notes (Signed)
  Speech Language Pathology Treatment: Cognitive-Linquistic  Patient Details Name: Andrea Arnold MRN: [id number] DOB: 1961/03/02 Today's Date: 01/24/2019 Time: 4098-1191 SLP Time Calculation (min) (ACUTE ONLY): 22 min  Assessment / Plan / Recommendation Clinical Impression  Pt reported that she has noticed an improvement in speed of word retrieval but that she is not yet at baseline. She was able to retrieve 12-15 items per concrete category within 1 minute during divergent naming tasks but was only able to retrieve 4-5 items per abstract category despite cues. During responsive naming tasks she demonstrated 70% accuracy with additional processing time increasing to 100% accuracy with min-mod cues. She was able to describe single-action photos with 83% accuracy increasing to 100% accuracy with min cues. SLP will continue to follow pt.    HPI HPI: 58 year old female admitted 01/22/2019 after tripping and falling from standing height, confused and unlike her baseline the next morning with mild headache. PMH: bladder cancer, HLD, HTN. Head CT = large left frontal intraparenchymal hematoma with 13mm rightward midline shift.      SLP Plan  Continue with current plan of care       Recommendations                   Follow up Recommendations: Inpatient Rehab SLP Visit Diagnosis: Aphasia (R47.01) Plan: Continue with current plan of care       Clarissa Laird I. Hardin Negus, [place], [place] Office number [phone] Pager [place] 01/24/2019, 1:06 PM

## 2019-01-24 NOTE — Progress Notes (Signed)
Per neurosurgery, OK to stop IV fluids if pt is drinking enough PO.

## 2019-01-25 ENCOUNTER — Inpatient Hospital Stay (HOSPITAL_COMMUNITY): Payer: BLUE CROSS/BLUE SHIELD

## 2019-01-25 MED ORDER — PANTOPRAZOLE SODIUM 40 MG PO TBEC: 40.0000 mg | DELAYED_RELEASE_TABLET | Freq: Every day | ORAL | Status: DC

## 2019-01-25 NOTE — TOC Initial Note (Signed)
Transition of Care [facility]) - Initial/Assessment Note    Patient Details  Name: Andrea Arnold MRN: [id number] Date of Birth: 1961/07/13  Transition of Care [facility]) CM/SW Contact:    Ella Bodo, RN Phone Number: 01/25/2019, 4:14 PM  Clinical Narrative:  58 year old female admitted 01/22/2019 after tripping and falling from standing height, confused and unlike her baseline the next morning with mild headache.  Head CT = large left frontal intraparenchymal hematoma with 4mm rightward midline shift.  PTA, pt independent, lives at home with spouse.  PT/OT and ST recommending HH follow up, and pt agreeable to services.  Referral to [facility], per pt choice.  Start of care 24-48h post dc date.  No DME recommended.  Husband able to provide 24h care at discharge.                 Expected Discharge Plan: [place] Barriers to Discharge: No Barriers Identified   Patient Goals and CMS Choice Patient states their goals for this hospitalization and ongoing recovery are:: to see my husband CMS Medicare.gov Compare Post Acute Care list provided to:: Patient Choice offered to / list presented to : Patient  Expected Discharge Plan and Services Expected Discharge Plan: [place]   Discharge Planning Services: CM Consult Post Acute Care Choice: [place] arrangements for the past 2 months: Single Family Home Expected Discharge Date: 01/25/19                   HH Arranged: PT, OT, Speech Therapy HH Agency: [facility]  Prior Living Arrangements/Services Living arrangements for the past 2 months: Single Family Home Lives with:: Spouse Patient language and need for interpreter reviewed:: No Do you feel safe going back to the place where you live?: Yes      Need for Family Participation in Patient Care: No (Comment) Care giver support system in place?: Yes (comment)   Criminal Activity/Legal Involvement Pertinent to Current  Situation/Hospitalization: No - Comment as needed  Activities of Daily Living      Permission Sought/Granted Permission sought to share information with : Case Manager Permission granted to share information with : Yes, Verbal Permission Granted              Emotional Assessment Appearance:: Appears stated age Attitude/Demeanor/Rapport: Engaged Affect (typically observed): Accepting, Appropriate Orientation: : Oriented to Self, Oriented to Place, Oriented to  Time, Oriented to Situation Alcohol / Substance Use: Not Applicable Psych Involvement: No (comment)  Admission diagnosis:  Traumatic hemorrhage of left cerebrum with loss of consciousness of 30 minutes or less, initial encounter [facility]) [S06.351A] Patient Active Problem List   Diagnosis Date Noted  . ICH (intracerebral hemorrhage) ([place]) 01/22/2019  . Postmenopausal 07/13/2018  . LGSIL (low grade squamous intraepithelial dysplasia) 01/22/2017  . Hematuria, microscopic 01/06/2017  . Dysuria 12/31/2016  . Pure hypercholesterolemia 12/27/2016  . Vitamin D deficiency 12/27/2016  . Abnormal CXR 01/25/2016  . Chronic fatigue 01/25/2016  . Cough 01/17/2016  . Routine general medical examination at health care facility 11/01/2013  . Hyperlipidemia LDL goal < 100 09/23/2012  . Abnormal EKG 09/23/2012   PCP:  Glendon Axe, MD Pharmacy:   [id number] [place], [place] [address] [place] [address] Phone: [phone] Fax: [phone]     Social Determinants of Health (SDOH) Interventions    Readmission Risk Interventions No flowsheet data found.   Reinaldo Raddle, RN,  BSN  Trauma/Neuro ICU Case Manager [phone]

## 2019-01-25 NOTE — Discharge Summary (Signed)
Physician Discharge Summary  Patient ID: Teagen Bucio MRN: [id number] DOB/AGE: [birth year] 58 y.o.  Admit date: 01/22/2019 Discharge date: 01/25/2019  Admission Diagnoses:  Discharge Diagnoses:  Active Problems:   ICH (intracerebral hemorrhage) [facility])   Discharged Condition: good  Hospital Course: Patient admitted to the hospital for evaluation and treatment of a large posttraumatic left frontal intracerebral hematoma.  Patient was admitted to the ICU for close neurological monitoring.  Her clinical situation progressively improved.  Currently she is wide awake.  She is talking.  She has minimal if any headaches.  She has no numbness paresthesias or weakness.  She has no speech or language dysfunction.  She is up ambulating with the aid of therapy.  She has had serial head CT scans which demonstrates no evidence of progression of bleeding or edema.  Consults:   Significant Diagnostic Studies:   Treatments:   Discharge Exam: Blood pressure (!) 159/84, pulse 86, temperature 98.9 F (37.2 C), temperature source Oral, resp. rate 12, height 5\' 6"  (1.676 m), weight 68 kg, SpO2 98 %. Awake and alert.  Oriented and appropriate.  Speech fluent.  Judgment insight intact.  Cranial nerve function normal bilaterally.  Motor and sensory function extremities normal.  Disposition: Discharge disposition: 01-Home or Self Care        Allergies as of 01/25/2019      Reactions   Ivp Dye [iodinated Diagnostic Agents] Nausea Only      Medication List    TAKE these medications   clotrimazole-betamethasone cream Commonly known as:  LOTRISONE Apply 1 application topically 2 (two) times daily as needed (for rash/irritated skin- to affected site(s)).   ibuprofen 200 MG tablet Commonly known as:  ADVIL,MOTRIN Take 200-400 mg by mouth every 6 (six) hours as needed for headache, mild pain or moderate pain.   multivitamin tablet Take 1 tablet by mouth daily.   rosuvastatin 5 MG tablet Commonly  known as:  CRESTOR Take 5 mg by mouth at bedtime.   VITAMIN D3 PO Take 1 capsule by mouth at bedtime.        Signed: Cooper Render Nieves Barberi 01/25/2019, 11:49 AM

## 2019-01-25 NOTE — Progress Notes (Signed)
Pt A&Ox4, no issues this AM. IV removed and belongings returned. Discharge paperwork reviewed and all questions answered with pt and husband. Pt wheeled to front entrance and sent home with husband.

## 2019-01-25 NOTE — Discharge Instructions (Signed)
Traumatic Brain Injury  Traumatic brain injury (TBI) is an injury to the brain that results from:  · A hard, direct blow to the head (closed injury).  · An object penetrating the skull and entering the brain (open injury).  Traumatic brain injury is also called a head injury or a concussion. TBI can be mild, moderate, or severe.  What are the causes?  Common causes of this condition include:  · Falls.  · Motor vehicle accidents.  · Sports injuries.  · Assaults.  What increases the risk?  You are more likely to develop this condition if you:  · Are 75 years old or older.  · Are a man.  · Play contact sports, especially football, hockey, or soccer.  · Are in the military.  · Are a victim of violence.  · Abuse drugs or alcohol.  · Have had a previous TBI.  What are the signs or symptoms?  Symptoms may vary from person to person, and may include:  · Loss of consciousness.  · Headache.  · Confusion.  · Fatigue.  · Changes in sleep.  · Dizziness.  · Mood or personality changes.  · Memory problems.  · Nausea or vomiting or both.  · Seizures.  · Clumsiness.  · Slurred speech.  · Depression and anxiety.  · Anger.  · Trouble concentrating, organizing, or making decisions.  · Inability to control emotions or actions (impulse control).  · Loss of or dulling of the senses, such as hearing, vision, and touch. This can include:  ? Blurred vision.  ? Ringing in your ears.  How is this diagnosed?  This condition may be diagnosed based on:  · Medical history and physical exam.  · Neurologic exam. This checks for brain and nervous system function, including your reflexes, memory, and coordination.  · CT scan.  Your TBI may be described as mild, moderate, or severe.  How is this treated?  Treatment depends on the severity of your brain injury and may include:  · Breathing support (mechanical ventilation).  · Blood pressure medicines.  · Pain medicines.  · Treatments to decrease the swelling in your brain.  · Brain surgery. This may  be needed to:  ? Remove a blood clot.  ? Repair bleeding.  ? Remove an object that has penetrated the brain, such as a skull fragment or a bullet.  Treatment of TBI also includes:  · Physical and mental rest.  · Careful observation.  · Medicine. You may be prescribed medicines to help with symptoms such as headaches, nausea, or difficulty sleeping.  · Physical, occupational, and speech therapy.  · Referral to a concussion clinic or rehabilitation center.  Follow these instructions at home:  Medicines  · Take over-the-counter and prescription medicines only as told by your health care provider.  · Do not take blood thinners (anticoagulants), aspirin, or other anti-inflammatory medicines such as ibuprofen or naproxen unless approved by your health care provider.  Activity  · Rest. Rest helps the brain to heal. Make sure you:  ? Get plenty of sleep. Most adults should get 7-9 hours of sleep each night.  ? Rest during the day. Take daytime naps or rest breaks when you feel tired.  · Do not do high-risk activities that could cause a second concussion, such as riding a bike or playing sports. Having another concussion before the first one has healed can be dangerous.  · Avoid a lot of visual stimulation. This includes work on the   computer or phone, watching TV, and reading.  · Ask your health care provider what kind of activities are safe for you. Your ability to react may be slower after a brain injury. Never do these activities if you are dizzy. Your health care provider will likely give you a plan for gradually returning to activities.  General instructions  · Do not drink alcohol.  · Watch your symptoms and tell others to do the same. Complications sometimes occur after a brain injury. Older adults with a brain injury may have a higher risk of serious complications.  · Seek support from friends and family.  · Keep all follow-up visits as directed by your health care provider. This is important.  Contact a health care  provider if:  · Your symptoms get worse or they do not improve.  · You have new symptoms.  · You have another injury.  Get help right away if:  · You have:  ? Severe, persistent headaches that are not relieved by medicine.  ? Weakness or numbness in any part of your body.  ? Confusion.  ? Slurred speech.  ? Difficulty waking up.  ? Nausea or persistent vomiting.  ? A feeling like you are moving when you are not (vertigo).  ? Seizures or you faint.  ? Changes in your vision.  ? Clear or bloody discharge from your nose or ears.  · You cannot use your arms or legs normally.  Summary  · Traumatic brain injury happens when there is a hard, direct blow to the head or when an object penetrates the skull and enters the brain.  · Traumatic brain injuries may be mild, moderate, or severe. Treatment depends on the severity of your injury.  · Get help right away if you have a head injury and you develop seizures, confusion, vomiting, weakness in the arms or legs, slurred speech, and other symptoms.  · Rest is one of the best treatments. Do not return to activity until your health care provider approves.  This information is not intended to replace advice given to you by your health care provider. Make sure you discuss any questions you have with your health care provider.  Document Released: 09/27/2002 Document Revised: 11/24/2017 Document Reviewed: 11/24/2017  Elsevier Interactive Patient Education © 2019 [employer]

## 2019-01-25 NOTE — Progress Notes (Signed)
Physical Therapy Treatment Patient Details Name: Andrea Arnold MRN: [id number] DOB: 04/08/1961 Today's Date: 01/25/2019    History of Present Illness 58 year old female admitted 01/22/2019 after tripping and falling from standing height, confused and unlike her baseline the next morning with mild headache. PMH: bladder cancer, HLD, HTN. Head CT = large left frontal intraparenchymal hematoma with 38mm rightward midline shift.     PT Comments    Pt cont to progress towards goals. Pt remains extremely flat and doesn't initiate conversation. Pt remains to have balance deficits as noted with LOB with head turns to the R and difficulty clearing R foot up the stairs requiring minA. Pt also cont to demonstrate R sided weakness and impaired cognition. Pt reports spouse is now home 24/7 as he is a professor at [employer]. If HHPT available pt safe to d/c home with assist of spouse and HHPT once medically stable. If not pt to cont to benefit from CIR to address above mentioned deficits.   Follow Up Recommendations  Home health PT;Supervision/Assistance - 24 hour(CIR if spouse can not provide 24/7 assist, per spouse)     Equipment Recommendations  None recommended by PT    Recommendations for Other Services Rehab consult     Precautions / Restrictions Precautions Precautions: Fall Restrictions Weight Bearing Restrictions: No    Mobility  Bed Mobility Overal bed mobility: Modified Independent Bed Mobility: Supine to Sit     Supine to sit: Modified independent (Device/Increase time)     General bed mobility comments: HOB elevated pt brought self to EOB without difficulty  Transfers Overall transfer level: Needs assistance Equipment used: None Transfers: Sit to/from Stand Sit to Stand: Supervision         General transfer comment: supervision for safety as pt mildly unsteady  Ambulation/Gait Ambulation/Gait assistance: Min guard Gait Distance (Feet): 170 Feet Assistive device:  None Gait Pattern/deviations: Decreased stride length;Step-through pattern Gait velocity: decreased Gait velocity interpretation: 1.31 - 2.62 ft/sec, indicative of limited community ambulator General Gait Details: pt slow and guarded, pt with LOB with turning head to the R requiring minA to prevent fall, pt with mild lateral vearing bilaterally   Stairs Stairs: Yes Stairs assistance: Min assist Stair Management: One rail Right;Alternating pattern;Forwards Number of Stairs: 12 General stair comments: pt with difficulty clearing R foot up the stairs, verbal cues to focus on lifting it high enough, minA to control descent down stairs due to instability   Wheelchair Mobility    Modified Rankin (Stroke Patients Only) Modified Rankin (Stroke Patients Only) Pre-Morbid Rankin Score: No symptoms Modified Rankin: Moderate disability     Balance Overall balance assessment: Needs assistance Sitting-balance support: Feet supported Sitting balance-Leahy Scale: Good     Standing balance support: No upper extremity supported Standing balance-Leahy Scale: Fair Standing balance comment: requires close guarding for safe amb                 Standardized Balance Assessment Standardized Balance Assessment : Dynamic Gait Index   Dynamic Gait Index Level Surface: Mild Impairment Change in Gait Speed: Mild Impairment Gait with Horizontal Head Turns: Mild Impairment Gait with Vertical Head Turns: Mild Impairment Gait and Pivot Turn: Mild Impairment Step Over Obstacle: Mild Impairment Step Around Obstacles: Mild Impairment Steps: Mild Impairment Total Score: 16      Cognition Arousal/Alertness: Awake/alert Behavior During Therapy: Flat affect Overall Cognitive Status: Impaired/Different from baseline Area of Impairment: Following commands;Safety/judgement;Awareness;Problem solving;Attention  Current Attention Level: Selective Memory: Decreased short-term  memory Following Commands: Follows one step commands consistently;Follows multi-step commands with increased time Safety/Judgement: Decreased awareness of deficits Awareness: Emergent Problem Solving: Requires verbal cues;Requires tactile cues General Comments: pt extremely flat, not initiating conversation. Pt was able to follow directions while ambulating in hallway and was able to navigate back to room without cues. Pt unable to remember however room number      Exercises      General Comments General comments (skin integrity, edema, etc.): VSS      Pertinent Vitals/Pain Pain Assessment: 0-10 Pain Score: 2  Pain Location: headache Pain Descriptors / Indicators: Headache Pain Intervention(s): Monitored during session    Home Living                      Prior Function            PT Goals (current goals can now be found in the care plan section) Acute Rehab PT Goals Patient Stated Goal: to get better Progress towards PT goals: Progressing toward goals    Frequency    Min 3X/week      PT Plan Current plan remains appropriate;Discharge plan needs to be updated    Co-evaluation              AM-PAC PT "6 Clicks" Mobility   Outcome Measure  Help needed turning from your back to your side while in a flat bed without using bedrails?: A Little Help needed moving from lying on your back to sitting on the side of a flat bed without using bedrails?: A Little Help needed moving to and from a bed to a chair (including a wheelchair)?: A Little Help needed standing up from a chair using your arms (e.g., wheelchair or bedside chair)?: A Little Help needed to walk in hospital room?: A Little Help needed climbing 3-5 steps with a railing? : A Little 6 Click Score: 18    End of Session Equipment Utilized During Treatment: Gait belt Activity Tolerance: Patient tolerated treatment well Patient left: in chair;with call bell/phone within reach;with chair alarm  set Nurse Communication: Mobility status PT Visit Diagnosis: Unsteadiness on feet (R26.81);Other symptoms and signs involving the nervous system (R29.898);Difficulty in walking, not elsewhere classified (R26.2)     Time: 9169-4503 PT Time Calculation (min) (ACUTE ONLY): 18 min  Charges:  $Gait Training: 8-22 mins                     Kittie Plater, PT, DPT Acute Rehabilitation Services Pager #: [phone] Office #: [phone]    Berline Lopes 01/25/2019, 8:32 AM

## 2019-01-25 NOTE — TOC Transition Note (Signed)
Transition of Care [facility]) - CM/SW Discharge Note   Patient Details  Name: Andrea Arnold MRN: [id number] Date of Birth: [birth year]  [facility] CM/SW Contact:  Ella Bodo, RN Phone Number: 01/25/2019, 4:17 PM   Clinical Narrative:  58 year old female admitted 01/22/2019 after tripping and falling from standing height, confused and unlike her baseline the next morning with mild headache.  Head CT = large left frontal intraparenchymal hematoma with 51mm rightward midline shift.  Pt medically stable for dc home today with spouse, who can provide 24h care.  HH PT/OT and ST arranged as recommended by therapies.       Final next level of care: Home w Home Health Services Barriers to Discharge: No Barriers Identified   Patient Goals and CMS Choice Patient states their goals for this hospitalization and ongoing recovery are:: to see my husband CMS Medicare.gov Compare Post Acute Care list provided to:: Patient Choice offered to / list presented to : Patient     Discharge Planning Services: CM Consult Post Acute Care Choice: Home Health              HH Arranged: PT, OT, Speech Therapy [place]: [place]     Readmission Risk Interventions No flowsheet data found.   Reinaldo Raddle, RN, BSN  Trauma/Neuro ICU Case Manager [phone]

## 2019-01-25 NOTE — Progress Notes (Signed)
Rehab Admissions Coordinator Note:  Patient was screened by Retta Diones for appropriateness for an Inpatient Acute Rehab Consult.  At this time, we are recommending HH.  Patient is now doing well with PT recommending [place] therapies for follow up.  Retta Diones 01/25/2019, 8:36 AM  I can be reached at [phone].

## 2019-01-25 NOTE — Progress Notes (Signed)
Occupational Therapy Treatment Patient Details Name: Andrea Arnold MRN: [id number] DOB: May 11, 1961 Today's Date: 01/25/2019    History of present illness 58 year old female admitted 01/22/2019 after tripping and falling from standing height, confused and unlike her baseline the next morning with mild headache. PMH: bladder cancer, HLD, HTN. Head CT = large left frontal intraparenchymal hematoma with 31mm rightward midline shift.    OT comments  Pt progressing towards established OT goals and is planning on dc home today. Pt dressing with supervision in preparation for dc. Administered the Pill Box Test. Pt passed with zero errors. However, when initially opening pill bottle, pt was unable to problem solve how to open them. Provided questioning cues to problem solve opening bottle, but required Max visual and verbal cues for problem solving how to open bottles. Pt also requiring significant time to complete task. Discussed pt's difficulty with initating task as well as requiring increased time. Recommended pt take time from work. Notified RN. Update dc recommendation to home with [place].    Follow Up Recommendations  Home health OT;Supervision/Assistance - 24 hour    Equipment Recommendations  None recommended by OT    Recommendations for Other Services Rehab consult;Speech consult;PT consult    Precautions / Restrictions Precautions Precautions: Fall Restrictions Weight Bearing Restrictions: No       Mobility Bed Mobility Overal bed mobility: Modified Independent       Supine to sit: Modified independent (Device/Increase time)        Transfers Overall transfer level: Needs assistance Equipment used: None Transfers: Sit to/from Stand Sit to Stand: Supervision         General transfer comment: supervision for safety as pt mildly unsteady    Balance Overall balance assessment: Needs assistance Sitting-balance support: Feet supported Sitting balance-Leahy Scale:  Good Sitting balance - Comments: able to self support and perfrom some modest dynamic tasks   Standing balance support: No upper extremity supported Standing balance-Leahy Scale: Fair Standing balance comment: requires close guarding for safe amb                           ADL either performed or assessed with clinical judgement   ADL Overall ADL's : Needs assistance/impaired                 Upper Body Dressing : Supervision/safety;Sitting Upper Body Dressing Details (indicate cue type and reason): donning bra and shirt Lower Body Dressing: Supervision/safety;Sit to/from stand Lower Body Dressing Details (indicate cue type and reason): Donning underwear, pants, socks, and shoes             Functional mobility during ADLs: Min guard General ADL Comments: Pt donning clothes in preparation for dc home. Administered Pill Box Test. see cognition section     Vision       Perception     Praxis      Cognition Arousal/Alertness: Awake/alert Behavior During Therapy: Flat affect Overall Cognitive Status: Impaired/Different from baseline Area of Impairment: Following commands;Safety/judgement;Awareness;Problem solving;Attention;Memory                   Current Attention Level: Selective Memory: Decreased short-term memory Following Commands: Follows one step commands consistently;Follows multi-step commands with increased time Safety/Judgement: Decreased awareness of deficits Awareness: Emergent Problem Solving: Requires verbal cues;Requires tactile cues General Comments: Administered the Pill Box Test. Pt passed with zero errors. However, when initially opening pill bottle, pt was unable to problem solve how to open  then. Pt repeating attempts to open them without changing her process. Provided questioning cues to problem solve opening bottle, but required Max visual and verbal cues for problem solving how to open bottles. Pt also requiring significant time  to complete task. Discussed pt's difficulty with initating task as well as requiring increased time. Recommended pt take time from work. Notified RN        Exercises     Shoulder Instructions       General Comments      Pertinent Vitals/ Pain       Pain Assessment: No/denies pain Pain Intervention(s): Monitored during session  Home Living                                          Prior Functioning/Environment              Frequency  Min 3X/week        Progress Toward Goals  OT Goals(current goals can now be found in the care plan section)  Progress towards OT goals: Progressing toward goals  Acute Rehab OT Goals Patient Stated Goal: to get better OT Goal Formulation: With patient Time For Goal Achievement: 02/06/19 Potential to Achieve Goals: Good ADL Goals Pt Will Perform Grooming: with modified independence;standing Pt Will Perform Lower Body Dressing: with modified independence;sit to/from stand Pt Will Transfer to Toilet: with modified independence;ambulating;regular height toilet Pt Will Perform Toileting - Clothing Manipulation and hygiene: with modified independence;sit to/from stand;sitting/lateral leans Additional ADL Goal #1: Pt will perform four ADLs with 1-2 cues for ST memory Additional ADL Goal #2: Pt will demonstrate selective attention during ADL with 1-2 cues  Plan Discharge plan needs to be updated    Co-evaluation                 AM-PAC OT "6 Clicks" Daily Activity     Outcome Measure   Help from another person eating meals?: None Help from another person taking care of personal grooming?: A Little Help from another person toileting, which includes using toliet, bedpan, or urinal?: A Little Help from another person bathing (including washing, rinsing, drying)?: A Little Help from another person to put on and taking off regular upper body clothing?: A Little Help from another person to put on and taking off  regular lower body clothing?: A Little 6 Click Score: 19    End of Session    OT Visit Diagnosis: Unsteadiness on feet (R26.81);Other abnormalities of gait and mobility (R26.89);Muscle weakness (generalized) (M62.81);Other symptoms and signs involving cognitive function;Hemiplegia and hemiparesis Hemiplegia - Right/Left: Right Hemiplegia - dominant/non-dominant: Dominant 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

## 2019-01-27 LAB — CULTURE, BLOOD (ROUTINE X 2)
Culture: NO GROWTH
Culture: NO GROWTH
Special Requests: ADEQUATE
Special Requests: ADEQUATE

## 2019-02-19 ENCOUNTER — Other Ambulatory Visit: Payer: Self-pay | Admitting: Neurosurgery

## 2019-02-19 DIAGNOSIS — S069X9D Unspecified intracranial injury with loss of consciousness of unspecified duration, subsequent encounter: Secondary | ICD-10-CM

## 2019-03-01 ENCOUNTER — Ambulatory Visit
Admission: RE | Admit: 2019-03-01 | Discharge: 2019-03-01 | Disposition: A | Discharge status: Home / Self Care | Payer: BLUE CROSS/BLUE SHIELD | Source: Ambulatory Visit | Attending: Neurosurgery | Admitting: Neurosurgery

## 2019-03-01 DIAGNOSIS — S069X9D Unspecified intracranial injury with loss of consciousness of unspecified duration, subsequent encounter: Secondary | ICD-10-CM

## 2019-06-02 ENCOUNTER — Encounter: Payer: Self-pay | Admitting: Urology

## 2019-06-02 ENCOUNTER — Ambulatory Visit (INDEPENDENT_AMBULATORY_CARE_PROVIDER_SITE_OTHER): Payer: BC Managed Care – PPO | Admitting: Urology

## 2019-06-02 ENCOUNTER — Other Ambulatory Visit: Payer: Self-pay

## 2019-06-02 VITALS — BP 147/83 | HR 89 | Ht 66.0 in | Wt 149.0 lb

## 2019-06-02 DIAGNOSIS — D494 Neoplasm of unspecified behavior of bladder: Secondary | ICD-10-CM | POA: Diagnosis not present

## 2019-06-02 NOTE — Progress Notes (Signed)
   06/02/2019   CC:  Chief Complaint  Patient presents with  . Cysto    HPI: Andrea Arnold is a 57 y.o. female who presents today for a 78-month surveillance cystoscopy.  Cystoscopy in 07/2018 following an initial episode of microscopic hematuria revealed a small bladder tumor.   On 08/31/2018, she underwent a transurethral resection of bladder tumor with bilobar retrogrades with findings of: Small, approximately 1 cm papillary urothelial carcinoma, low-grade, non-invasive, located distally on the left UO.  Blood pressure (!) 147/83, pulse 89, height 5\' 6"  (1.676 m), weight 149 lb (67.6 kg). NED. A&Ox3.   No respiratory distress   Abd soft, NT, ND Normal external genitalia with patent urethral meatus  Cystoscopy Procedure Note  Patient identification was confirmed, informed consent was obtained, and patient was prepped using Betadine solution.  Lidocaine jelly was administered per urethral meatus.    Procedure: - Flexible cystoscope introduced, without any difficulty.   - Thorough search of the bladder revealed:    normal urethral meatus    normal urothelium    no stones    no ulcers     no tumors    no urethral polyps    no trabeculation    stellate scar just beyond left UO with no evidence of recurrence  - Ureteral orifices were normal in position and appearance.  Post-Procedure: - Patient tolerated the procedure well  Assessment/ Plan:  1. Bladder cancer - NED today on cystoscopy - Return for surveillance cystoscopy in 6 months per AUA guidelines until 2021 then annually  Hollice Espy, MD    Return in about 6 months (around 12/03/2019) for cysto.

## 2019-06-03 LAB — URINALYSIS, COMPLETE
Bilirubin, UA: NEGATIVE
Glucose, UA: NEGATIVE
Ketones, UA: NEGATIVE
Leukocytes,UA: NEGATIVE
Nitrite, UA: NEGATIVE
Protein,UA: NEGATIVE
Specific Gravity, UA: 1.02 (ref 1.005–1.030)
Urobilinogen, Ur: 0.2 mg/dL (ref 0.2–1.0)
pH, UA: 7.5 (ref 5.0–7.5)

## 2019-06-03 LAB — MICROSCOPIC EXAMINATION
Bacteria, UA: NONE SEEN
Epithelial Cells (non renal): NONE SEEN /hpf (ref 0–10)
WBC, UA: NONE SEEN /hpf (ref 0–5)

## 2019-09-01 ENCOUNTER — Other Ambulatory Visit: Payer: Self-pay | Admitting: Obstetrics and Gynecology

## 2019-09-01 DIAGNOSIS — Z1231 Encounter for screening mammogram for malignant neoplasm of breast: Secondary | ICD-10-CM

## 2019-10-01 ENCOUNTER — Ambulatory Visit
Admission: RE | Admit: 2019-10-01 | Discharge: 2019-10-01 | Disposition: A | Discharge status: Home / Self Care | Payer: BC Managed Care – PPO | Source: Ambulatory Visit | Attending: Obstetrics and Gynecology | Admitting: Obstetrics and Gynecology

## 2019-10-01 DIAGNOSIS — Z1231 Encounter for screening mammogram for malignant neoplasm of breast: Secondary | ICD-10-CM | POA: Insufficient documentation

## 2019-12-08 ENCOUNTER — Ambulatory Visit: Payer: BC Managed Care – PPO | Admitting: Urology

## 2019-12-08 ENCOUNTER — Other Ambulatory Visit: Payer: Self-pay

## 2019-12-08 ENCOUNTER — Encounter: Payer: Self-pay | Admitting: Urology

## 2019-12-08 VITALS — BP 135/91 | HR 76

## 2019-12-08 DIAGNOSIS — D494 Neoplasm of unspecified behavior of bladder: Secondary | ICD-10-CM | POA: Diagnosis not present

## 2019-12-08 LAB — URINALYSIS, COMPLETE
Bilirubin, UA: NEGATIVE
Glucose, UA: NEGATIVE
Ketones, UA: NEGATIVE
Leukocytes,UA: NEGATIVE
Nitrite, UA: NEGATIVE
Protein,UA: NEGATIVE
Specific Gravity, UA: <1.005 — ABNORMAL LOW (ref 1.005–1.030)
Urobilinogen, Ur: 0.2 mg/dL (ref 0.2–1.0)
pH, UA: 5.5 (ref 5.0–7.5)

## 2019-12-08 LAB — MICROSCOPIC EXAMINATION: Bacteria, UA: NONE SEEN

## 2019-12-08 NOTE — Progress Notes (Signed)
   12/08/2019   CC:  Chief Complaint  Patient presents with  . Cysto    HPI: Andrea Arnold is a 59 y.o. female who presents today for a 37-month surveillance cystoscopy.  Cystoscopy in 07/2018 following an initial episode of microscopic hematuria revealed a small bladder tumor.   On 08/31/2018, she underwent a transurethral resection of bladder tumor with bilobar retrogrades with findings of: Small, approximately 1 cm papillary urothelial carcinoma, low-grade, non-invasive, located distally on the left UO.  No recurrences.  No urinary issues.  No blood in urine today.  Blood pressure (!) 135/91, pulse 76. NED. A&Ox3.   No respiratory distress   Abd soft, NT, ND Normal external genitalia with patent urethral meatus  Cystoscopy Procedure Note  Patient identification was confirmed, informed consent was obtained, and patient was prepped using Betadine solution.  Lidocaine jelly was administered per urethral meatus.    Procedure: - Flexible cystoscope introduced, without any difficulty.   - Thorough search of the bladder revealed:    normal urethral meatus    normal urothelium    no stones    no ulcers     no tumors    no urethral polyps    no trabeculation    stellate scar just beyond left UO with no evidence of recurrence  - Ureteral orifices were normal in position and appearance.  Post-Procedure: - Patient tolerated the procedure well  Assessment/ Plan:  1. Bladder cancer - NED today on cystoscopy - Transition to annual cysto per AUA guidelines    Return in about 1 year (around 12/07/2020) for cysto.  Hollice Espy, MD

## 2020-09-06 ENCOUNTER — Other Ambulatory Visit: Payer: Self-pay | Admitting: Obstetrics and Gynecology

## 2020-09-06 DIAGNOSIS — Z1231 Encounter for screening mammogram for malignant neoplasm of breast: Secondary | ICD-10-CM

## 2020-10-04 ENCOUNTER — Other Ambulatory Visit: Payer: Self-pay

## 2020-10-04 ENCOUNTER — Ambulatory Visit
Admission: RE | Admit: 2020-10-04 | Discharge: 2020-10-04 | Disposition: A | Discharge status: Home / Self Care | Payer: BC Managed Care – PPO | Source: Ambulatory Visit | Attending: Obstetrics and Gynecology | Admitting: Obstetrics and Gynecology

## 2020-10-04 DIAGNOSIS — Z1231 Encounter for screening mammogram for malignant neoplasm of breast: Secondary | ICD-10-CM

## 2020-12-06 ENCOUNTER — Ambulatory Visit: Payer: BC Managed Care – PPO | Admitting: Urology

## 2020-12-06 ENCOUNTER — Encounter: Payer: Self-pay | Admitting: Urology

## 2020-12-06 ENCOUNTER — Other Ambulatory Visit: Payer: Self-pay

## 2020-12-06 VITALS — BP 138/88 | HR 72 | Ht 66.0 in | Wt 174.0 lb

## 2020-12-06 DIAGNOSIS — D494 Neoplasm of unspecified behavior of bladder: Secondary | ICD-10-CM | POA: Diagnosis not present

## 2020-12-06 DIAGNOSIS — R3129 Other microscopic hematuria: Secondary | ICD-10-CM

## 2020-12-06 LAB — MICROSCOPIC EXAMINATION

## 2020-12-06 LAB — URINALYSIS, COMPLETE
Bilirubin, UA: NEGATIVE
Glucose, UA: NEGATIVE
Ketones, UA: NEGATIVE
Nitrite, UA: NEGATIVE
Specific Gravity, UA: 1.02 (ref 1.005–1.030)
Urobilinogen, Ur: 0.2 mg/dL (ref 0.2–1.0)
pH, UA: 7.5 (ref 5.0–7.5)

## 2020-12-06 NOTE — Progress Notes (Signed)
   12/06/2020   CC:  Chief Complaint  Patient presents with  . Cysto    HPI: Andrea Arnold is a 60 y.o. female who presents today for a 43-month surveillance cystoscopy.  Cystoscopy in 07/2018 following an initial episode of microscopic hematuria revealed a small bladder tumor.   On 08/31/2018, she underwent a transurethral resection of bladder tumor with bilobar retrogrades with findings of: Small, approximately 1 cm papillary urothelial carcinoma, low-grade, non-invasive, located distally on the left UO.  No recurrences.  No urinary issues.  She does have microscopic blood in her urine today, 11-30 red blood cells.  Denies any gross hematuria.  Blood pressure 138/88, pulse 72, height 5\' 6"  (1.676 m), weight 174 lb (78.9 kg). NED. A&Ox3.   No respiratory distress   Abd soft, NT, ND Normal external genitalia with patent urethral meatus  Cystoscopy Procedure Note  Patient identification was confirmed, informed consent was obtained, and patient was prepped using Betadine solution.  Lidocaine jelly was administered per urethral meatus.    Procedure: - Flexible cystoscope introduced, without any difficulty.   - Thorough search of the bladder revealed:    normal urethral meatus    normal urothelium    no stones    no ulcers     no tumors    no urethral polyps    no trabeculation    stellate scar just beyond left UO with no evidence of recurrence  - Ureteral orifices were normal in position and appearance.  Post-Procedure: - Patient tolerated the procedure well  Assessment/ Plan:  1. History of bladder cancer - NED today on cystoscopy -Continue annual surveillance cystoscopy  2. Hematuria, microscopic Ongoing microscopic hematuria today  No recent upper tract imaging, will order renal ultrasound call her with these results.  - Ultrasound renal complete; Future   Hollice Espy, MD

## 2020-12-13 ENCOUNTER — Other Ambulatory Visit: Payer: Self-pay

## 2020-12-13 ENCOUNTER — Ambulatory Visit
Admission: RE | Admit: 2020-12-13 | Discharge: 2020-12-13 | Disposition: A | Discharge status: Home / Self Care | Payer: BC Managed Care – PPO | Source: Ambulatory Visit | Attending: Urology | Admitting: Urology

## 2020-12-13 DIAGNOSIS — R3129 Other microscopic hematuria: Secondary | ICD-10-CM | POA: Diagnosis not present

## 2020-12-19 ENCOUNTER — Telehealth: Payer: Self-pay | Admitting: *Deleted

## 2020-12-19 NOTE — Telephone Encounter (Addendum)
Left VM asked to return call with any concerns  ----- Message from Hollice Espy, MD sent at 12/18/2020  4:25 PM EST ----- RUS is normal, great news!   Hollice Espy, MD

## 2021-10-18 ENCOUNTER — Other Ambulatory Visit: Payer: Self-pay | Admitting: Obstetrics and Gynecology

## 2021-10-18 DIAGNOSIS — Z1231 Encounter for screening mammogram for malignant neoplasm of breast: Secondary | ICD-10-CM

## 2021-12-10 NOTE — Progress Notes (Signed)
° °  12/11/21  CC:  Chief Complaint  Patient presents with   Cysto     HPI: Andrea Arnold is a 62 y.o.female with a personal history of bladder cancer and microscopic hematuria who presents today for annual cystoscopy.   Cystoscopy in 07/2018 following an initial episode of microscopic hematuria revealed a small bladder tumor.    On 08/31/2018, she underwent a transurethral resection of bladder tumor with bilobar retrogrades with findings of: Small, approximately 1 cm papillary urothelial carcinoma, low-grade, non-invasive, located distally on the left UO.   She underwent a RUS on 12/13/2020 to further evaluate microscopic hematuria, that was unremarkable.   She does have microscopic blood in her urine today, 3-10 RBC  Vitals:   12/11/21 0904  BP: (!) 134/91  Pulse: 82   NED. A&Ox3.   No respiratory distress   Abd soft, NT, ND Normal external genitalia with patent urethral meatus  Cystoscopy Procedure Note  Patient identification was confirmed, informed consent was obtained, and patient was prepped using Betadine solution.  Lidocaine jelly was administered per urethral meatus.    Procedure: - Flexible cystoscope introduced, without any difficulty.   - Thorough search of the bladder revealed:    normal urethral meatus    normal urothelium    no stones    no ulcers     no tumors    no urethral polyps    no trabeculation    stellate scar just beyond left UO with no evidence of recurrence - Ureteral orifices were normal in position and appearance.  Post-Procedure: - Patient tolerated the procedure well  Assessment/ Plan:  1. History of bladder cancer - NED today on cystoscopy -Continue annual surveillance cystoscopy -consider repeat upper tract imaging next year with microscopic hematuria persists   2. Hematuria, microscopic - Ongoing microscopic hematuria today  F/u 1 year for cysto  I,Kailey Littlejohn,acting as a scribe for Hollice Espy, MD.,have  documented all relevant documentation on the behalf of Hollice Espy, MD,as directed by  Hollice Espy, MD while in the presence of Hollice Espy, MD.  I have reviewed the above documentation for accuracy and completeness, and I agree with the above.   Hollice Espy, MD

## 2021-12-11 ENCOUNTER — Other Ambulatory Visit: Payer: Self-pay

## 2021-12-11 ENCOUNTER — Ambulatory Visit: Payer: BC Managed Care – PPO | Admitting: Urology

## 2021-12-11 VITALS — BP 134/91 | HR 82 | Ht 66.0 in | Wt 175.0 lb

## 2021-12-11 DIAGNOSIS — D494 Neoplasm of unspecified behavior of bladder: Secondary | ICD-10-CM

## 2021-12-11 LAB — URINALYSIS, COMPLETE
Bilirubin, UA: NEGATIVE
Glucose, UA: NEGATIVE
Ketones, UA: NEGATIVE
Leukocytes,UA: NEGATIVE
Nitrite, UA: NEGATIVE
Protein,UA: NEGATIVE
Specific Gravity, UA: 1.01 (ref 1.005–1.030)
Urobilinogen, Ur: 0.2 mg/dL (ref 0.2–1.0)
pH, UA: 6 (ref 5.0–7.5)

## 2021-12-11 LAB — MICROSCOPIC EXAMINATION

## 2022-04-10 IMAGING — MG DIGITAL SCREENING BILAT W/ TOMO W/ CAD
8 series · 8 of 24 positions shown · non-contrast
Comparison: Previous exam(s).

CLINICAL DATA: Screening.

EXAM:
DIGITAL SCREENING BILATERAL MAMMOGRAM WITH TOMO AND CAD

[L CC synth-2D]
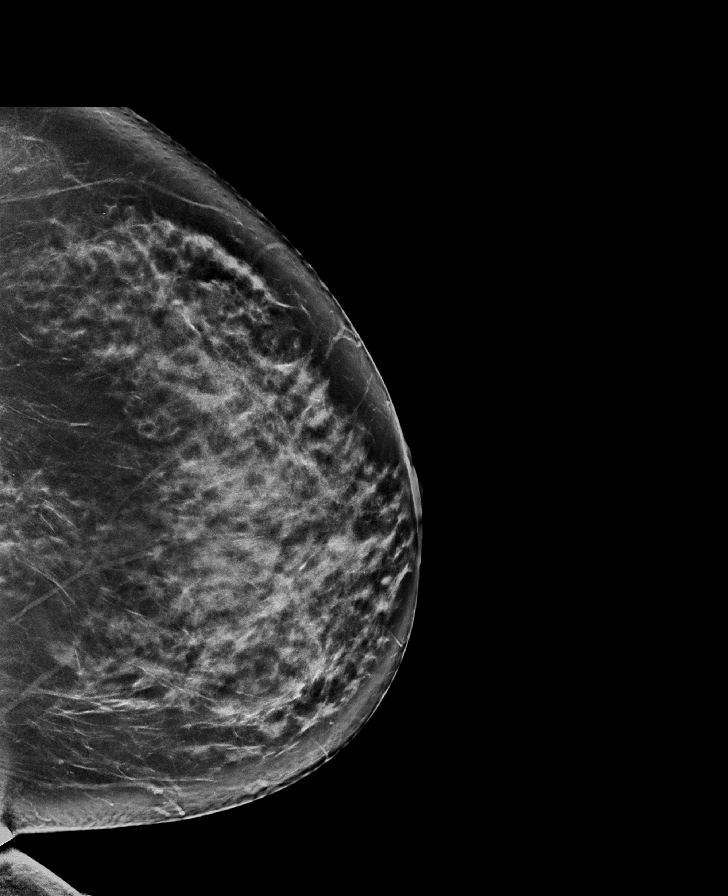

[L MLO synth-2D]
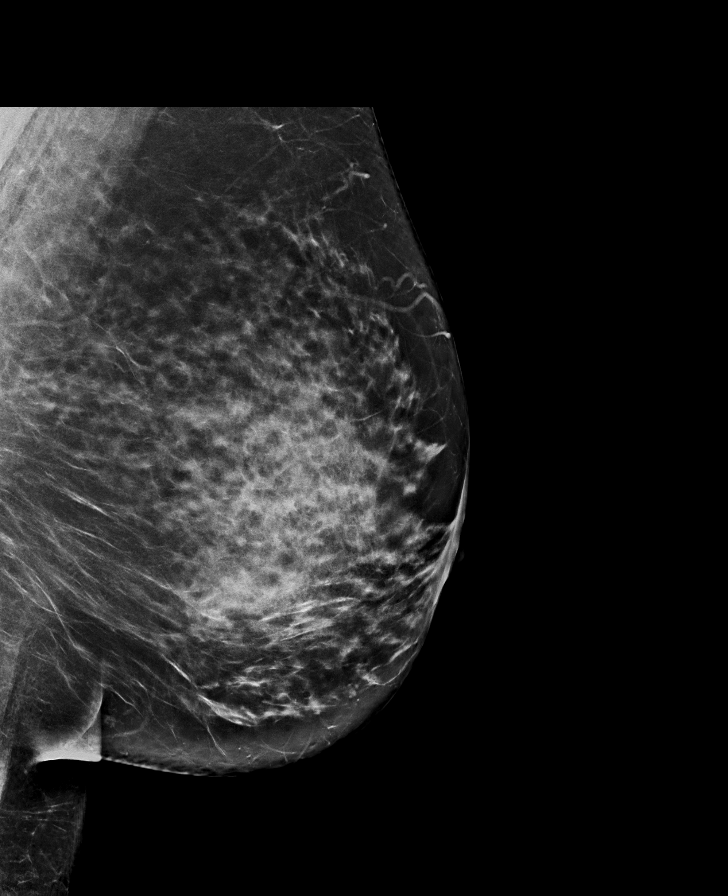

[R MLO synth-2D]
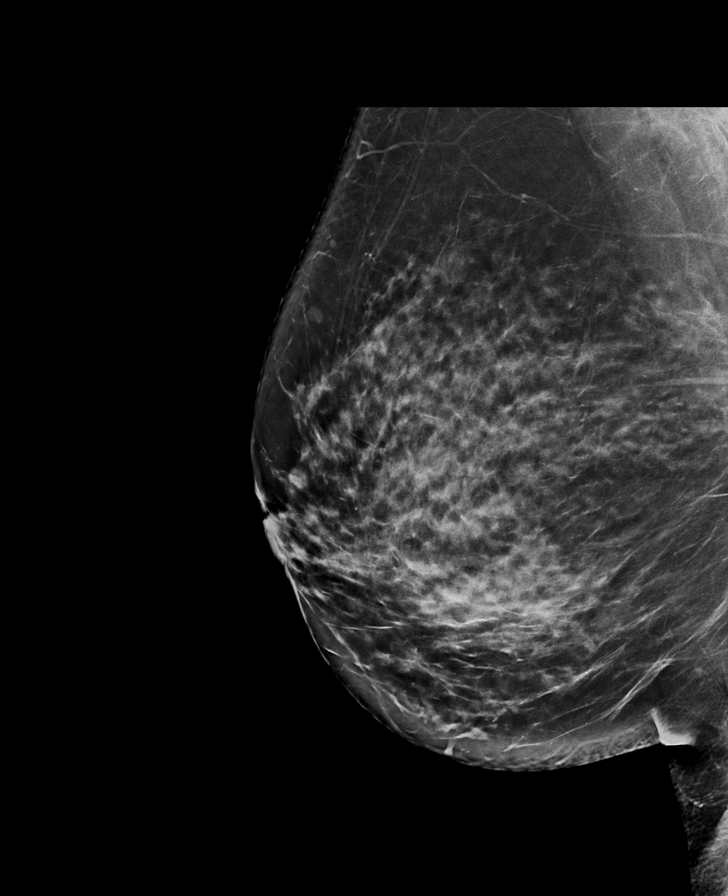

[R CC synth-2D]
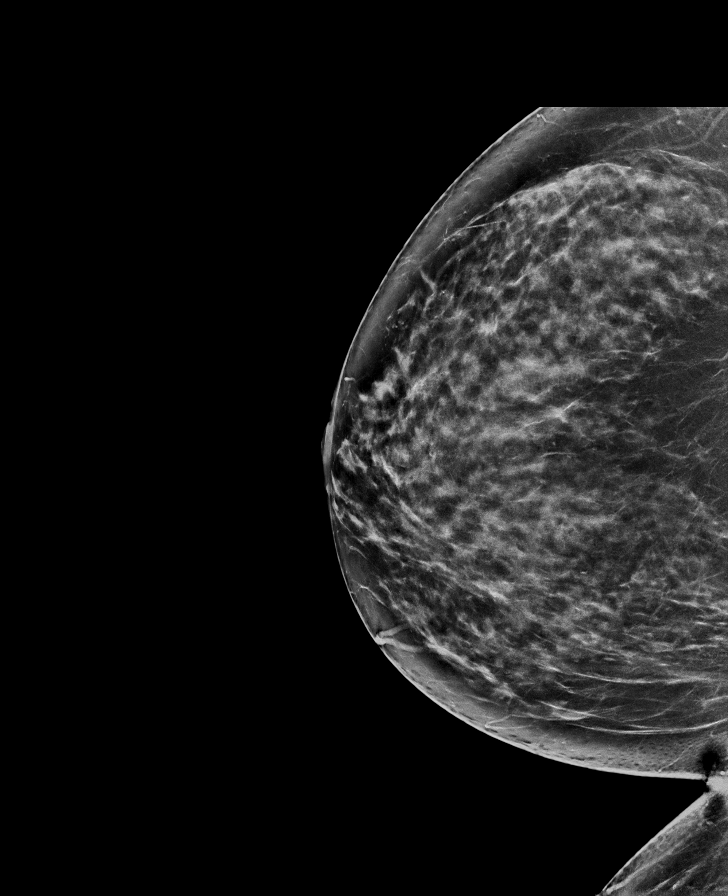

[L CC tomo · tomo slice 45/90.0]
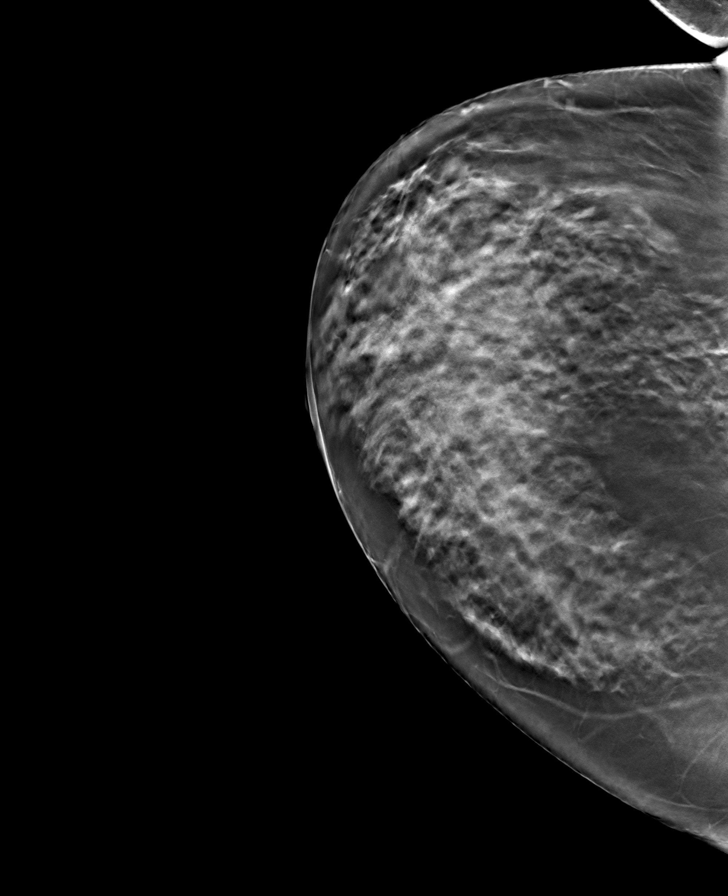

[R CC tomo · tomo slice 45/89.0]
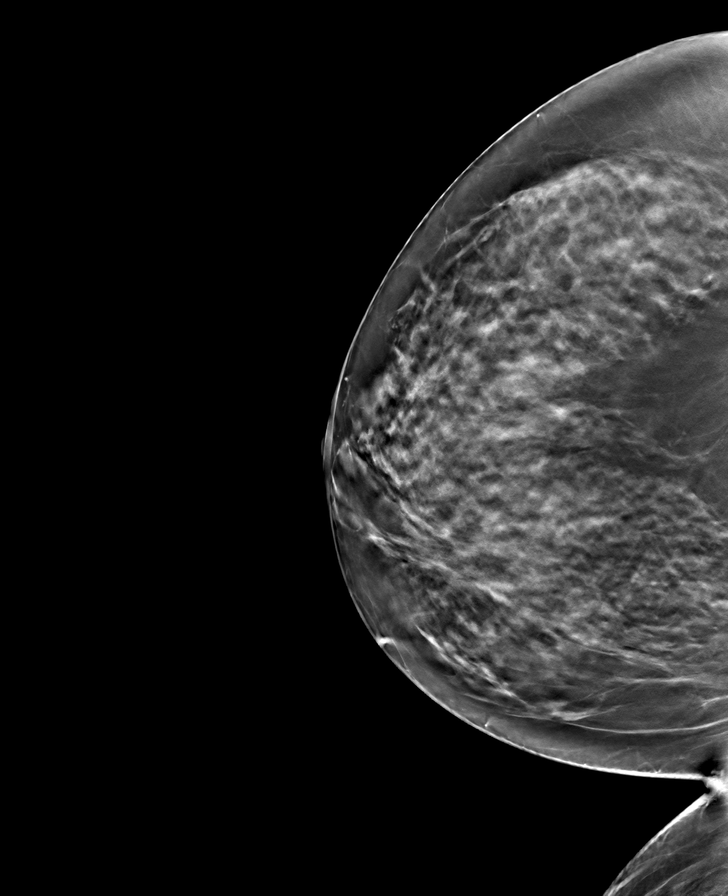

[R MLO tomo · tomo slice 47/92.0]
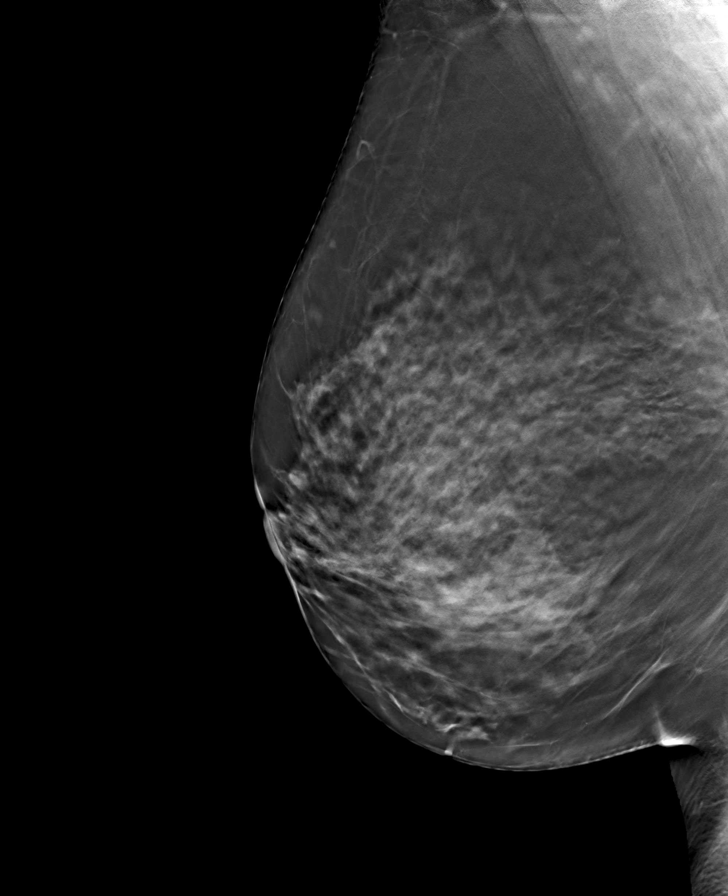

[L MLO tomo · tomo slice 47/92.0]
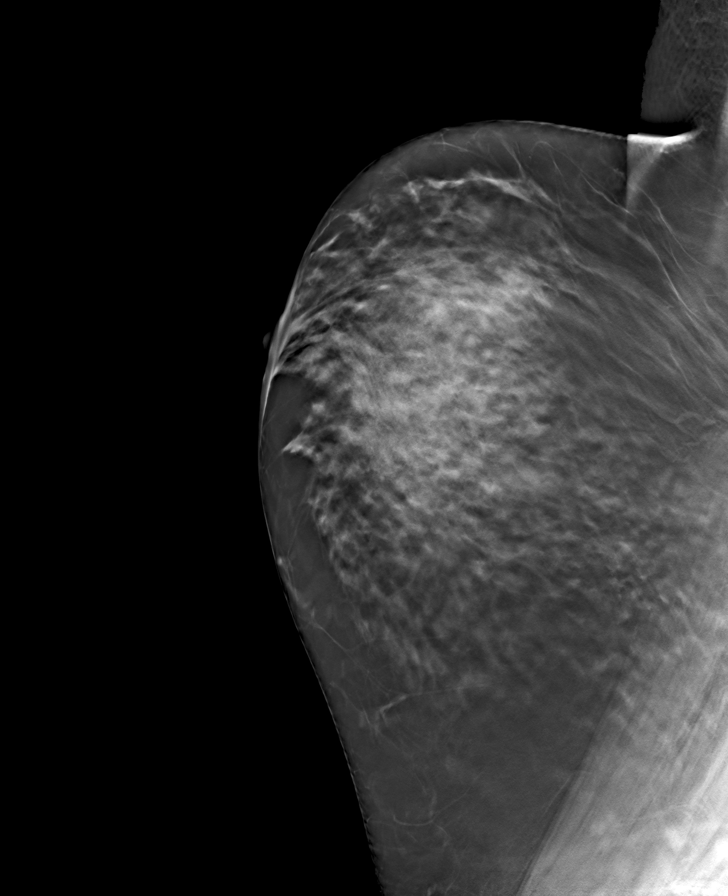

[8 of 24 positions shown; findings below may reference images not displayed]

ACR Breast Density Category c: The breast tissue is heterogeneously
dense, which may obscure small masses.
FINDINGS: There are no findings suspicious for malignancy. Images were
processed with CAD.
IMPRESSION: No mammographic evidence of malignancy. A result letter of this
screening mammogram will be mailed directly to the patient.

RECOMMENDATION:
Screening mammogram in one year. (Code:FT-U-LHB)

BI-RADS CATEGORY  1: Negative.

## 2022-06-19 IMAGING — US US RENAL
1 series · 14 of 25 positions shown · non-contrast
Comparison: None.

CLINICAL DATA: Microscopic hematuria

EXAM:
RENAL / URINARY TRACT ULTRASOUND COMPLETE

[Series 1: us renal · 0.26mm/px · 14 of 33 slices shown]
[im 1/33]
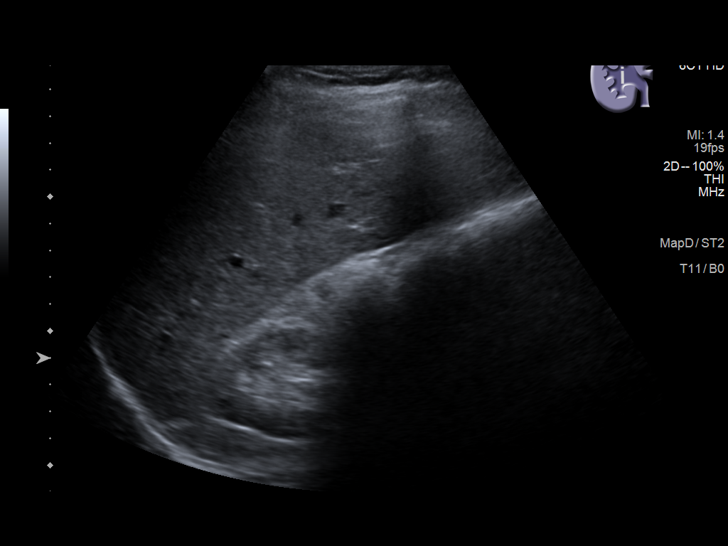
[im 3/33]
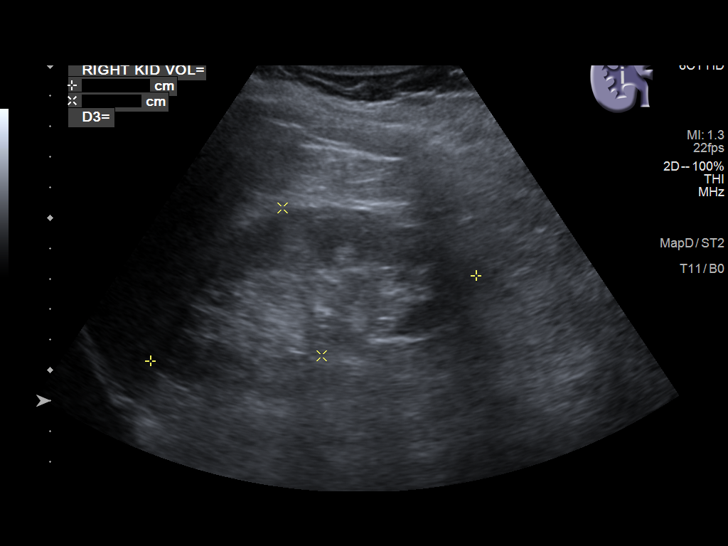
[im 6/33]
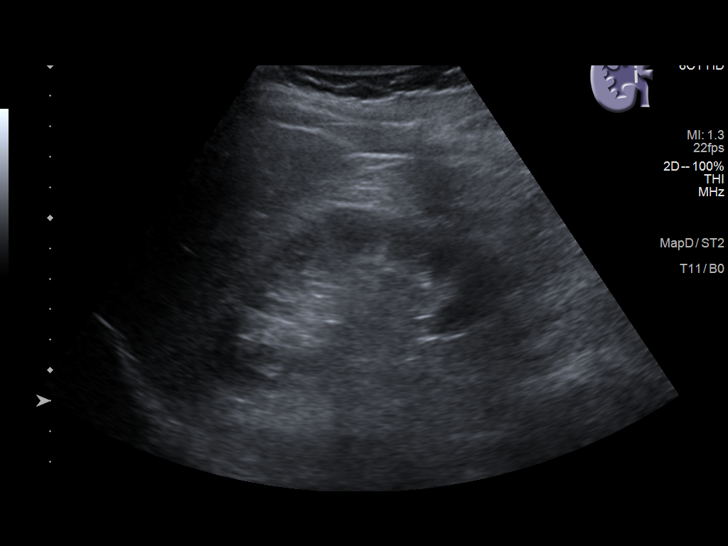
[im 9/33]
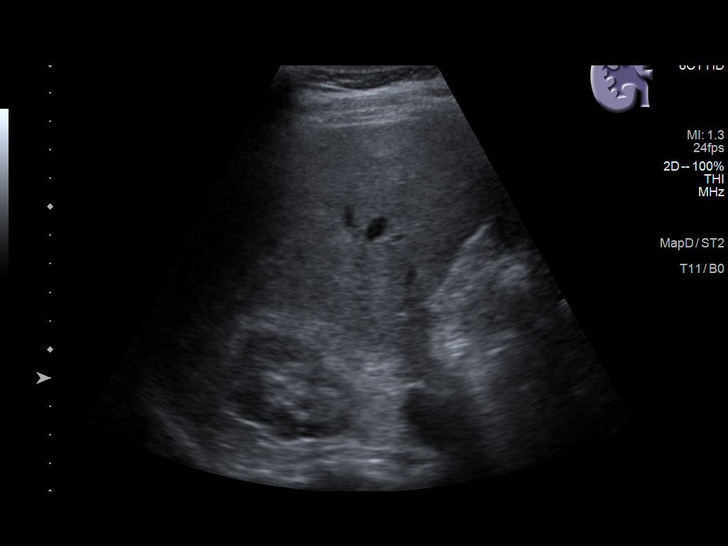
[im 11/33]
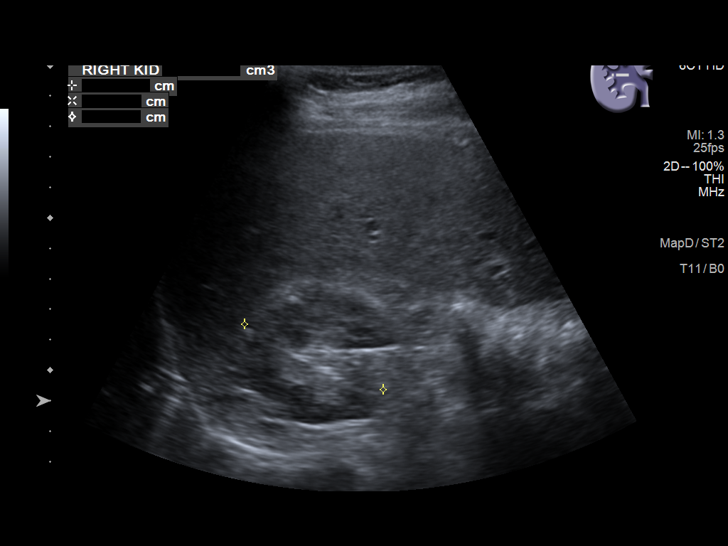
[im 13/33]
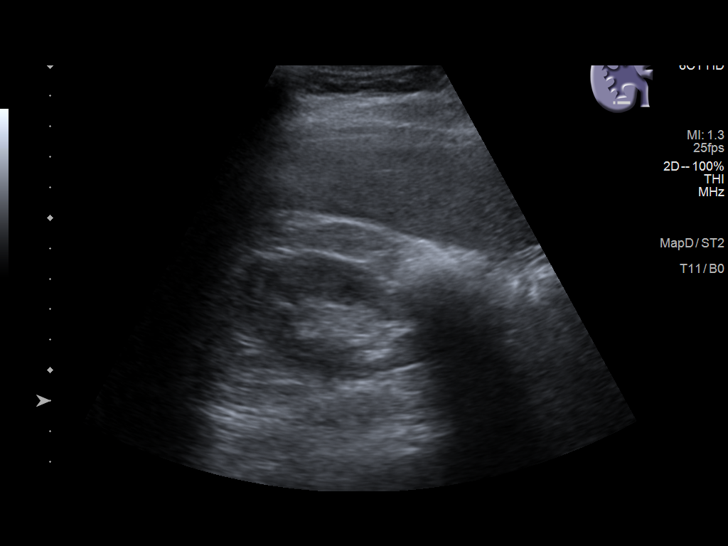
[im 15/33]
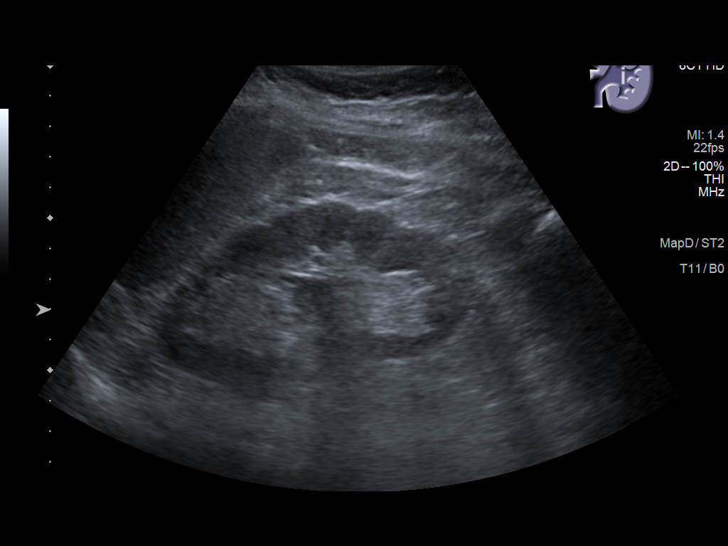
[im 18/33]
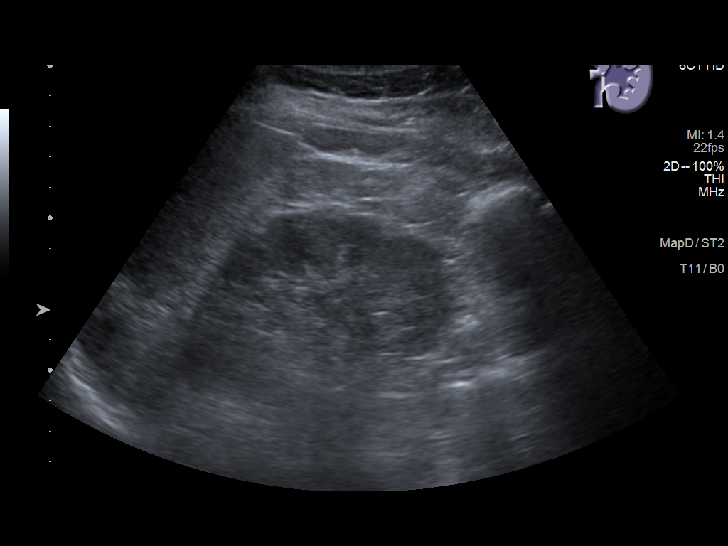
[im 21/33]
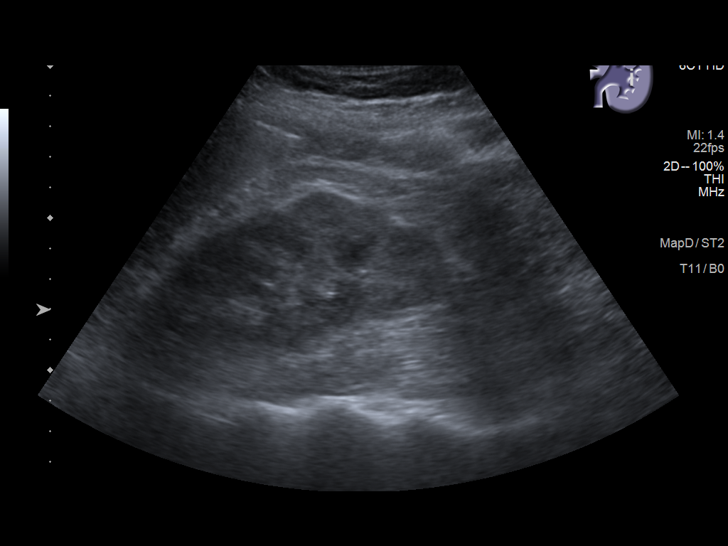
[im 22/33]
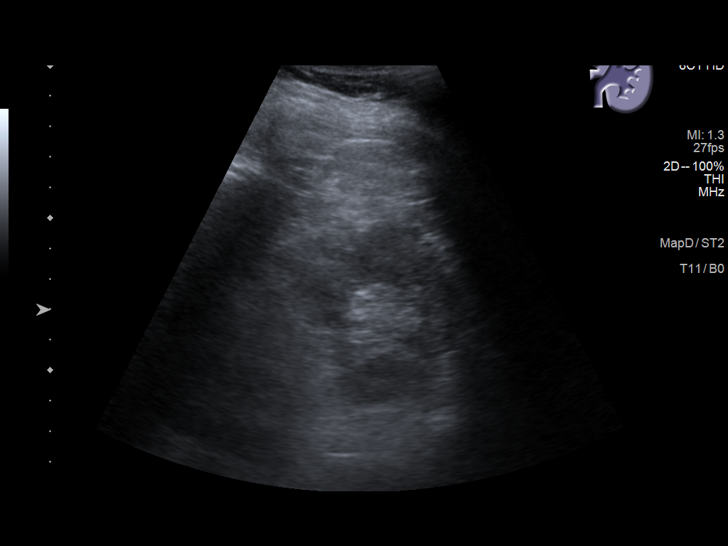
[im 25/33]
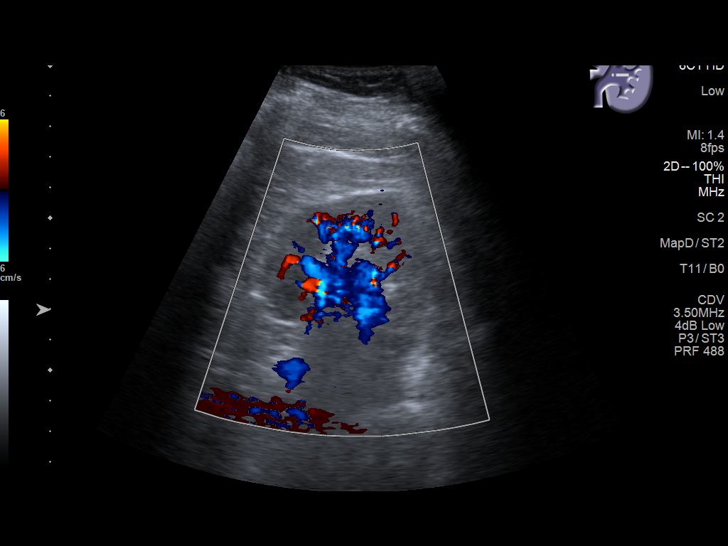
[im 27/33]
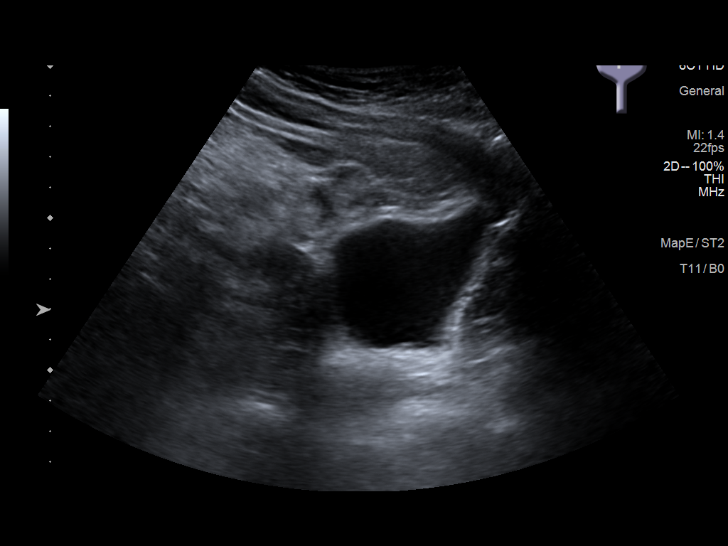
[im 30/33]
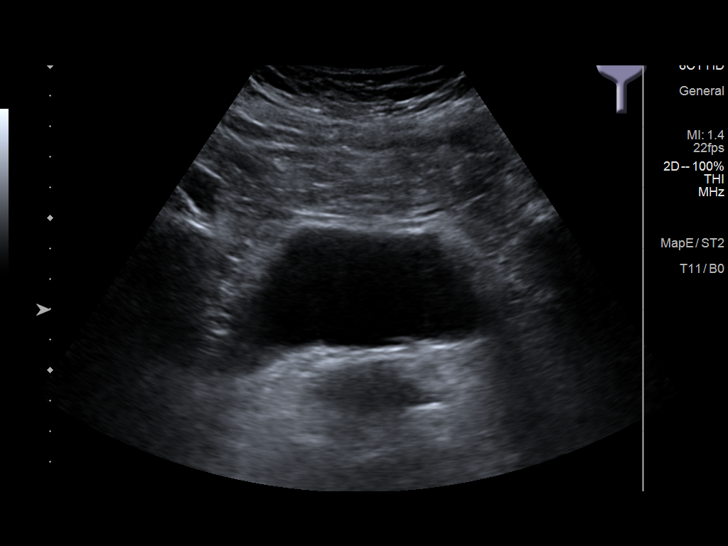
[im 33/33]
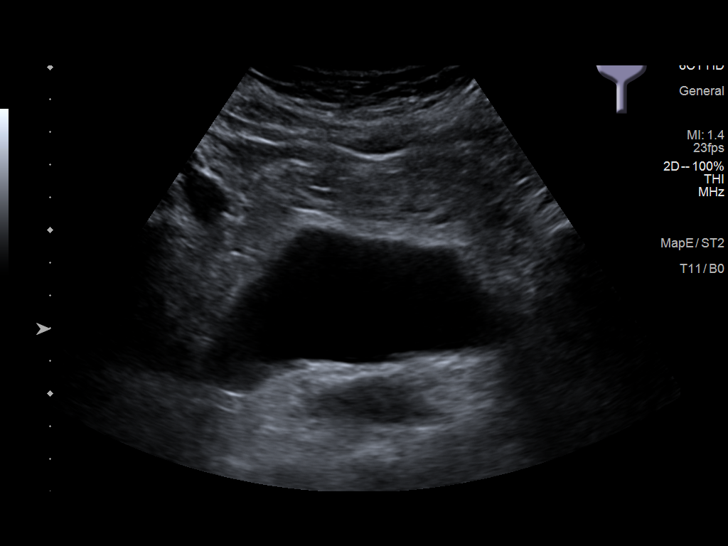

[14 of 25 positions shown; findings below may reference images not displayed]

FINDINGS: Right Kidney:

Renal measurements: 11.1 x 5.0 x 5.0 cm = volume: 146 mL.
Echogenicity within normal limits. No mass or hydronephrosis
visualized.

Left Kidney:

Renal measurements: 11 x 5.6 x 5.5 cm = volume: 177 mL. Echogenicity
within normal limits. No mass or hydronephrosis visualized.

Bladder:

Appears normal for degree of bladder distention.

Other:

None.
IMPRESSION: Normal renal ultrasound.

## 2022-09-04 ENCOUNTER — Other Ambulatory Visit: Payer: Self-pay | Admitting: Obstetrics and Gynecology

## 2022-09-04 DIAGNOSIS — Z1231 Encounter for screening mammogram for malignant neoplasm of breast: Secondary | ICD-10-CM

## 2022-12-02 ENCOUNTER — Ambulatory Visit
Admission: RE | Admit: 2022-12-02 | Discharge: 2022-12-02 | Disposition: A | Discharge status: Home / Self Care | Payer: BC Managed Care – PPO | Source: Ambulatory Visit | Attending: Obstetrics and Gynecology | Admitting: Obstetrics and Gynecology

## 2022-12-02 DIAGNOSIS — Z1231 Encounter for screening mammogram for malignant neoplasm of breast: Secondary | ICD-10-CM | POA: Insufficient documentation

## 2022-12-17 ENCOUNTER — Ambulatory Visit: Payer: BC Managed Care – PPO | Admitting: Urology

## 2022-12-17 VITALS — BP 147/84 | HR 80 | Ht 66.0 in | Wt 181.0 lb

## 2022-12-17 DIAGNOSIS — D494 Neoplasm of unspecified behavior of bladder: Secondary | ICD-10-CM

## 2022-12-17 DIAGNOSIS — Z8551 Personal history of malignant neoplasm of bladder: Secondary | ICD-10-CM | POA: Diagnosis not present

## 2022-12-17 DIAGNOSIS — R3129 Other microscopic hematuria: Secondary | ICD-10-CM

## 2022-12-17 LAB — URINALYSIS, COMPLETE
Bilirubin, UA: NEGATIVE
Glucose, UA: NEGATIVE
Ketones, UA: NEGATIVE
Leukocytes,UA: NEGATIVE
Nitrite, UA: NEGATIVE
Protein,UA: NEGATIVE
Specific Gravity, UA: <1.005 — ABNORMAL LOW (ref 1.005–1.030)
Urobilinogen, Ur: 0.2 mg/dL (ref 0.2–1.0)
pH, UA: 6.5 (ref 5.0–7.5)

## 2022-12-17 LAB — MICROSCOPIC EXAMINATION

## 2022-12-17 NOTE — Progress Notes (Signed)
   12/17/22  CC:  Chief Complaint  Patient presents with   Cysto     HPI: Andrea Arnold is a 62 y.o.female with a personal history of bladder cancer and microscopic hematuria who presents today for annual cystoscopy.   Cystoscopy in 07/2018 following an initial episode of microscopic hematuria revealed a small bladder tumor.    On 08/31/2018, she underwent a transurethral resection of bladder tumor with bilobar retrogrades with findings of: Small, approximately 1 cm papillary urothelial carcinoma, low-grade, non-invasive, located distally on the left UO.   She underwent a RUS on 12/13/2020 to further evaluate microscopic hematuria, that was unremarkable.   She does have 11-30 red blood cells per high-power field today.  She has no urinary symptoms.  Vitals:   12/17/22 0912  BP: (!) 147/84  Pulse: 80   NED. A&Ox3.   No respiratory distress   Abd soft, NT, ND Normal external genitalia with patent urethral meatus  Cystoscopy Procedure Note  Patient identification was confirmed, informed consent was obtained, and patient was prepped using Betadine solution.  Lidocaine jelly was administered per urethral meatus.    Procedure: - Flexible cystoscope introduced, without any difficulty.   - Thorough search of the bladder revealed:    normal urethral meatus    normal urothelium    no stones    no ulcers     no tumors    no urethral polyps    no trabeculation    stellate scar just beyond left UO with no evidence of recurrence - Ureteral orifices were normal in position and appearance.  Post-Procedure: - Patient tolerated the procedure well  Assessment/ Plan:  1. History of bladder cancer - NED today on cystoscopy -Continue annual surveillance cystoscopy -RUS for persistent microscopic hematuria   2. Hematuria, microscopic - Ongoing microscopic hematuria today  F/u 1 year for cysto; will call with RUS results  Hollice Espy, MD

## 2022-12-24 ENCOUNTER — Ambulatory Visit
Admission: RE | Admit: 2022-12-24 | Discharge: 2022-12-24 | Disposition: A | Discharge status: Home / Self Care | Payer: BC Managed Care – PPO | Source: Ambulatory Visit | Attending: Urology | Admitting: Urology

## 2022-12-24 DIAGNOSIS — R3129 Other microscopic hematuria: Secondary | ICD-10-CM | POA: Insufficient documentation

## 2023-11-18 ENCOUNTER — Other Ambulatory Visit: Payer: Self-pay | Admitting: Obstetrics and Gynecology

## 2023-11-18 DIAGNOSIS — Z1231 Encounter for screening mammogram for malignant neoplasm of breast: Secondary | ICD-10-CM

## 2023-12-17 ENCOUNTER — Ambulatory Visit: Payer: Self-pay | Admitting: Urology

## 2023-12-17 VITALS — BP 126/82 | HR 93

## 2023-12-17 DIAGNOSIS — Z8551 Personal history of malignant neoplasm of bladder: Secondary | ICD-10-CM | POA: Diagnosis not present

## 2023-12-17 DIAGNOSIS — R3129 Other microscopic hematuria: Secondary | ICD-10-CM

## 2023-12-17 LAB — URINALYSIS, COMPLETE
Bilirubin, UA: NEGATIVE
Glucose, UA: NEGATIVE
Ketones, UA: NEGATIVE
Leukocytes,UA: NEGATIVE
Nitrite, UA: NEGATIVE
Protein,UA: NEGATIVE
Specific Gravity, UA: 1.02 (ref 1.005–1.030)
Urobilinogen, Ur: 0.2 mg/dL (ref 0.2–1.0)
pH, UA: 5.5 (ref 5.0–7.5)

## 2023-12-17 LAB — MICROSCOPIC EXAMINATION

## 2023-12-17 NOTE — Progress Notes (Signed)
   12/17/23  CC:  Chief Complaint  Patient presents with   Cysto     HPI: Andrea Arnold is a 63 y.o.female with a personal history of bladder cancer and microscopic hematuria who presents today for annual cystoscopy.   Cystoscopy in 07/2018 following an initial episode of microscopic hematuria revealed a small bladder tumor.    On 08/31/2018, she underwent a transurethral resection of bladder tumor with bilobar retrogrades with findings of: Small, approximately 1 cm papillary urothelial carcinoma, low-grade, non-invasive, located distally on the left UO.   She underwent a RUS on 3/24 to further evaluate microscopic hematuria, that was unremarkable.   She does have 11-30 red blood cells per high-power field today.  She has no urinary symptoms.  Vitals:   12/17/23 0856  BP: 126/82  Pulse: 93   NED. A&Ox3.   No respiratory distress   Abd soft, NT, ND Normal external genitalia with patent urethral meatus  Cystoscopy Procedure Note  Patient identification was confirmed, informed consent was obtained, and patient was prepped using Betadine solution.  Lidocaine jelly was administered per urethral meatus.    Procedure: - Flexible cystoscope introduced, without any difficulty.   - Thorough search of the bladder revealed:    normal urethral meatus    normal urothelium    no stones    no ulcers     no tumors    no urethral polyps    no trabeculation    stellate scar just beyond left UO with no evidence of recurrence - Ureteral orifices were normal in position and appearance.  Post-Procedure: - Patient tolerated the procedure well  Assessment/ Plan:  1. History of bladder cancer - NED today on cystoscopy -Continue annual surveillance cystoscopy   2. Hematuria, microscopic - Ongoing microscopic hematuria today -consider upper tract imaging in 1-2 years  F/u 1 year for cysto  Vanna Scotland, MD

## 2024-03-16 ENCOUNTER — Encounter: Payer: Self-pay | Admitting: Licensed Clinical Social Worker

## 2024-03-16 ENCOUNTER — Other Ambulatory Visit: Payer: Self-pay | Admitting: Licensed Clinical Social Worker

## 2024-03-16 ENCOUNTER — Inpatient Hospital Stay: Payer: BC Managed Care – PPO | Attending: Oncology | Admitting: Licensed Clinical Social Worker

## 2024-03-16 ENCOUNTER — Inpatient Hospital Stay: Payer: BC Managed Care – PPO

## 2024-03-16 DIAGNOSIS — Z803 Family history of malignant neoplasm of breast: Secondary | ICD-10-CM

## 2024-03-16 DIAGNOSIS — Z8551 Personal history of malignant neoplasm of bladder: Secondary | ICD-10-CM

## 2024-03-16 DIAGNOSIS — Z8041 Family history of malignant neoplasm of ovary: Secondary | ICD-10-CM

## 2024-03-16 DIAGNOSIS — Z1379 Encounter for other screening for genetic and chromosomal anomalies: Secondary | ICD-10-CM | POA: Diagnosis not present

## 2024-03-16 LAB — GENETIC SCREENING ORDER

## 2024-03-16 NOTE — Progress Notes (Signed)
 REFERRING PROVIDER: Schermerhorn, Joselyn Nicely, MD [address] [facility] [place],  [place] [address]  PRIMARY PROVIDER:  Lyle San, MD  PRIMARY REASON FOR VISIT:  1. Personal history of bladder cancer   2. Family history of breast cancer   3. Family history of ovarian cancer      HISTORY OF PRESENT ILLNESS:   Andrea Arnold, a 63 y.o. female, was seen for a [place] cancer genetics consultation at the request of Dr. Madelene Schanz due to a personal and family history of cancer.  Andrea Arnold presents to clinic today to discuss the possibility of a hereditary predisposition to cancer, genetic testing, and to further clarify her future cancer risks, as well as potential cancer risks for family members.   CANCER HISTORY:  At the age of 89, Andrea Arnold was diagnosed with bladder cancer. This was treated with surgery.  RISK FACTORS:  Menarche was at age 24-15.  First live birth at age 21.  OCP use: no Ovaries intact: yes.  Hysterectomy: no.  HRT use: 0 years. Colonoscopy: yes; normal. Mammogram within the last year: yes. Number of breast biopsies: 0.  Past Medical History:  Diagnosis Date   Cancer [facility]) 2019   Bladder   Chicken pox    Complication of anesthesia    disoriented and took a long time to feel normal after anesthesia   Hyperlipidemia    Hypertension     Past Surgical History:  Procedure Laterality Date   ABLATION  2004   CERVICAL BIOPSY  W/ LOOP ELECTRODE EXCISION  2015   COLONOSCOPY     CYSTOSCOPY W/ RETROGRADES Bilateral 08/31/2018   Procedure: CYSTOSCOPY WITH RETROGRADE PYELOGRAM;  Surgeon: Dustin Gimenez, MD;  Location: [facility] ORS;  Service: Urology;  Laterality: Bilateral;   CYSTOSCOPY WITH FULGERATION  08/31/2018   Procedure: CYSTOSCOPY WITH FULGERATION;  Surgeon: Dustin Gimenez, MD;  Location: [facility] ORS;  Service: Urology;;   [facility] ABLATION     TRANSURETHRAL RESECTION OF BLADDER TUMOR WITH MITOMYCIN -C N/A 08/31/2018    Procedure: TRANSURETHRAL RESECTION OF BLADDER TUMOR WITH Gemcitabine;  Surgeon: Dustin Gimenez, MD;  Location: [facility] ORS;  Service: Urology;  Laterality: N/A;    FAMILY HISTORY:  We obtained a detailed, 4-generation family history.  Significant diagnoses are listed below: Family History  Problem Relation Age of Onset   Hyperlipidemia Mother    Hypertension Father    Skin cancer Father        melanoma   Alcohol abuse Sister    Drug abuse Sister    Ovarian cancer Sister 49   Mental illness Sister    Breast cancer Maternal Aunt 70       dx late 60s   Hyperlipidemia Maternal Grandmother    Heart disease Maternal Grandmother    Hypertension Maternal Grandmother    Hyperlipidemia Maternal Grandfather    Heart disease Maternal Grandfather    Hypertension Maternal Grandfather    Breast cancer Cousin 79       dx late 55s   Andrea Arnold has 2 daughters, 50 and 62. She has 1 brother who passed recently of aggressive lymphoma at 65. She also has 1 sister who had ovarian cancer at 81 and is living at at 65, she has limited contact with her but she does not believe she has had genetic testing.   Andrea Arnold mother is living at 40. Patient's maternal aunt had breast cancer in her late 61s and passed of a recurrence in her 45s. A maternal cousin also  had breast cancer in her late 30s and reportedly had negative genetic testing, although this was more than 10 years ago.    Andrea Arnold father had melanoma removed from his head at 74, he passed at 37. No other known cancers on this side of the family.  Andrea Arnold is unaware of previous family history of genetic testing for hereditary cancer risks. There is no reported Ashkenazi Jewish ancestry. There is no known consanguinity.    GENETIC COUNSELING ASSESSMENT: Andrea Arnold is a 63 y.o. female with a personal and family history of cancer which is somewhat suggestive of a hereditary cancer syndrome and predisposition to cancer. We, therefore,  discussed and recommended the following at today's visit.   DISCUSSION: We discussed that approximately 10-20% of ovarian cancer is hereditary. Most cases of hereditary ovarian cancer are associated with BRCA1/2 genes, although ovarian cancer can also be associated with Lynch syndrome which increases risk for bladder cancer as well. There are other genes associated with hereditary cancer as well. Cancers and risks are gene specific. We discussed that testing is beneficial for several reasons including knowing about cancer risks, identifying potential screening and risk-reduction options that may be appropriate, and to understand if other family members could be at risk for cancer and allow them to undergo genetic testing.   We reviewed the characteristics, features and inheritance patterns of hereditary cancer syndromes. We also discussed genetic testing, including the appropriate family members to test, the process of testing, insurance coverage and turn-around-time for results. We discussed the implications of a negative, positive and/or variant of uncertain significant result. We recommended Andrea Arnold pursue genetic testing for the [facility] CancerNext-Expanded+RNA gene panel.   Based on Andrea Arnold's personal and family history of cancer, she meets medical criteria for genetic testing. Despite that she meets criteria, she may still have an out of pocket cost.   We discussed that some people do not want to undergo genetic testing due to fear of genetic discrimination.  A federal law called the Genetic Information Non-Discrimination Act (GINA) of 2008 helps protect individuals against genetic discrimination based on their genetic test results.  It impacts both health insurance and employment.  For health insurance, it protects against increased premiums, being kicked off insurance or being forced to take a test in order to be insured.  For employment it protects against hiring, firing and promoting decisions  based on genetic test results.  Health status due to a cancer diagnosis is not protected under GINA.  This law does not protect life insurance, disability insurance, or other types of insurance.   PLAN: After considering the risks, benefits, and limitations, Andrea Arnold provided informed consent to pursue genetic testing and the blood sample was sent to [facility] for analysis of the CancerNext-Expanded+RNA panel. Results should be available within approximately 2-3 weeks' time, at which point they will be disclosed by telephone to Andrea Arnold, as will any additional recommendations warranted by these results. Andrea Arnold will receive a summary of her genetic counseling visit and a copy of her results once available. This information will also be available in Epic.  Andrea Arnold questions were answered to her satisfaction today. Our contact information was provided should additional questions or concerns arise. Thank you for the referral and allowing us  to share in the care of your patient.   Andrea Gee, MS, [facility] Genetic Counselor [email or web address] Phone: [phone]  50 minutes were spent on the date of the encounter in service to the patient including preparation,  face-to-face consultation, documentation and care coordination. Dr. Nelson Bandy was available for discussion regarding this case.   _______________________________________________________________________ For Office Staff:  Number of people involved in session: 2 Was an Intern/ student involved with case: yes; [employer] intern Andrea Arnold was also present and assisted with this case.

## 2024-03-30 ENCOUNTER — Telehealth: Payer: Self-pay | Admitting: Licensed Clinical Social Worker

## 2024-03-30 ENCOUNTER — Encounter: Payer: Self-pay | Admitting: Licensed Clinical Social Worker

## 2024-03-30 DIAGNOSIS — Z1379 Encounter for other screening for genetic and chromosomal anomalies: Secondary | ICD-10-CM | POA: Insufficient documentation

## 2024-03-30 NOTE — Telephone Encounter (Signed)
 I contacted Ms. Tinnell to discuss her genetic testing results. No pathogenic variants were identified in the 77 genes analyzed. Detailed clinic note to follow.   The test report has been scanned into EPIC and is located under the Molecular Pathology section of the Results Review tab.  A portion of the result report is included below for reference.      Valri Gee, MS, [facility] Genetic Counselor [email or web address] Phone: [phone]

## 2024-03-31 ENCOUNTER — Ambulatory Visit: Payer: Self-pay | Admitting: Licensed Clinical Social Worker

## 2024-03-31 DIAGNOSIS — Z1379 Encounter for other screening for genetic and chromosomal anomalies: Secondary | ICD-10-CM

## 2024-03-31 NOTE — Progress Notes (Signed)
 HPI:   Andrea Arnold was previously seen in the [facility] due to a personal and family history of cancer and concerns regarding a hereditary predisposition to cancer. Please refer to our prior cancer genetics clinic note for more information regarding our discussion, assessment and recommendations, at the time. Andrea Arnold recent genetic test results were disclosed to her, as were recommendations warranted by these results. These results and recommendations are discussed in more detail below.  CANCER HISTORY:  Oncology History   No history exists.    FAMILY HISTORY:  We obtained a detailed, 4-generation family history.  Significant diagnoses are listed below: Family History  Problem Relation Age of Onset   Hyperlipidemia Mother    Hypertension Father    Skin cancer Father        melanoma   Alcohol abuse Sister    Drug abuse Sister    Ovarian cancer Sister 36   Mental illness Sister    Breast cancer Maternal Aunt 38       dx late 13s   Hyperlipidemia Maternal Grandmother    Heart disease Maternal Grandmother    Hypertension Maternal Grandmother    Hyperlipidemia Maternal Grandfather    Heart disease Maternal Grandfather    Hypertension Maternal Grandfather    Breast cancer Cousin 68       dx late 98s    Andrea Arnold has 2 daughters, 8 and 10. She has 1 brother who passed recently of aggressive lymphoma at 33. She also has 1 sister who had ovarian cancer at 20 and is living at at 42, she has limited contact with her but she does not believe she has had genetic testing.    Andrea Arnold mother is living at 57. Patient's maternal aunt had breast cancer in her late 39s and passed of a recurrence in her 47s. A maternal cousin also had breast cancer in her late 62s and reportedly had negative genetic testing, although this was more than 10 years ago.     Andrea Arnold father had melanoma removed from his head at 83, he passed at 34. No other known cancers on this  side of the family.   Andrea Arnold is unaware of previous family history of genetic testing for hereditary cancer risks. There is no reported Ashkenazi Jewish ancestry. There is no known consanguinity.     GENETIC TEST RESULTS:  The [facility] CancerNext-Expanded+RNA Panel found no pathogenic mutations.   The CancerNext-Expanded gene panel offered by [facility] and includes sequencing, rearrangement, and RNA analysis for the following 77 genes: AIP, ALK, APC, ATM, AXIN2, BAP1, BARD1, BMPR1A, BRCA1, BRCA2, BRIP1, CDC73, CDH1, CDK4, CDKN1B, CDKN2A, CEBPA, CHEK2, CTNNA1, DDX41, DICER1, ETV6, FH, FLCN, GATA2, LZTR1, MAX, MBD4, MEN1, MET, MLH1, MSH2, MSH3, MSH6, MUTYH, NF1, NF2, NTHL1, PALB2, PHOX2B, PMS2, POT1, PRKAR1A, PTCH1, PTEN, RAD51C, RAD51D, RB1, RET, RPS20, RUNX1, SDHA, SDHAF2, SDHB, SDHC, SDHD, SMAD4, SMARCA4, SMARCB1, SMARCE1, STK11, SUFU, TMEM127, TP53, TSC1, TSC2, VHL, and WT1 (sequencing and deletion/duplication); EGFR, HOXB13, KIT, MITF, PDGFRA, POLD1, and POLE (sequencing only); EPCAM and GREM1 (deletion/duplication only).   The test report has been scanned into EPIC and is located under the Molecular Pathology section of the Results Review tab.  A portion of the result report is included below for reference. Genetic testing reported out on 03/26/2024.       Even though a pathogenic variant was not identified, possible explanations for the cancer in the family may include: There may be no hereditary risk for cancer  in the family. The cancers in Andrea Arnold and/or her family may be sporadic/familial or due to other genetic and environmental factors. There may be a gene mutation in one of these genes that current testing methods cannot detect but that chance is small. There could be another gene that has not yet been discovered, or that we have not yet tested, that is responsible for the cancer diagnoses in the family.  It is also possible there is a hereditary cause for the cancer in the  family that Andrea Arnold did not inherit.  Therefore, it is important to remain in touch with cancer genetics in the future so that we can continue to offer Andrea Arnold the most up to date genetic testing.   ADDITIONAL GENETIC TESTING:  We discussed with Andrea Arnold that her genetic testing was fairly extensive.  If there are additional relevant genes identified to increase cancer risk that can be analyzed in the future, we would be happy to discuss and coordinate this testing at that time.    CANCER SCREENING RECOMMENDATIONS:  Andrea Arnold test result is considered negative (normal).  This means that we have not identified a hereditary cause for her personal and family history of cancer at this time.   An individual's cancer risk and medical management are not determined by genetic test results alone. Overall cancer risk assessment incorporates additional factors, including personal medical history, family history, and any available genetic information that may result in a personalized plan for cancer prevention and surveillance. Therefore, it is recommended she continue to follow the cancer management and screening guidelines provided by her primary healthcare provider.  RECOMMENDATIONS FOR FAMILY MEMBERS:   Since she did not inherit a identifiable mutation in a cancer predisposition gene included on this panel, her children could not have inherited a known mutation from her in one of these genes. Individuals in this family might be at some increased risk of developing cancer, over the general population risk, due to the family history of cancer.  Individuals in the family should notify their providers of the family history of cancer. We recommend women in this family have a yearly mammogram beginning at age 42, or 66 years younger than the earliest onset of cancer, an annual clinical breast exam, and perform monthly breast self-exams.  Family members should have colonoscopies by at age 32, or  earlier, as recommended by their providers. Other members of the family may still carry a pathogenic variant in one of these genes that Andrea Arnold did not inherit. Based on the family history, we recommend her sister who had ovarian cancer at 90 have genetic counseling and testing. Andrea Arnold will let us  know if we can be of any assistance in coordinating genetic counseling and/or testing for this family member.   FOLLOW-UP:  Lastly, we discussed with Andrea Arnold that cancer genetics is a rapidly advancing field and it is possible that new genetic tests will be appropriate for her and/or her family members in the future. We encouraged her to remain in contact with cancer genetics on an annual basis so we can update her personal and family histories and let her know of advances in cancer genetics that may benefit this family.   Our contact number was provided. Andrea Arnold questions were answered to her satisfaction, and she knows she is welcome to call us  at anytime with additional questions or concerns.    Valri Gee, MS, [facility] Genetic Counselor [email or web address] Phone: [phone]

## 2024-06-08 ENCOUNTER — Encounter: Payer: Self-pay | Admitting: Urology

## 2024-08-04 ENCOUNTER — Ambulatory Visit: Admitting: Anesthesiology

## 2024-08-04 ENCOUNTER — Ambulatory Visit
Admission: RE | Admit: 2024-08-04 | Discharge: 2024-08-04 | Disposition: A | Discharge status: Home / Self Care | Attending: Gastroenterology | Admitting: Gastroenterology

## 2024-08-04 ENCOUNTER — Encounter: Payer: Self-pay | Admitting: Gastroenterology

## 2024-08-04 ENCOUNTER — Encounter
Admission: RE | Disposition: A | Discharge status: Home / Self Care | Payer: Self-pay | Source: Home / Self Care | Attending: Gastroenterology

## 2024-08-04 DIAGNOSIS — Z79899 Other long term (current) drug therapy: Secondary | ICD-10-CM | POA: Diagnosis not present

## 2024-08-04 DIAGNOSIS — I10 Essential (primary) hypertension: Secondary | ICD-10-CM | POA: Diagnosis not present

## 2024-08-04 DIAGNOSIS — Z1211 Encounter for screening for malignant neoplasm of colon: Secondary | ICD-10-CM | POA: Diagnosis present

## 2024-08-04 DIAGNOSIS — E785 Hyperlipidemia, unspecified: Secondary | ICD-10-CM | POA: Insufficient documentation

## 2024-08-04 DIAGNOSIS — Z87891 Personal history of nicotine dependence: Secondary | ICD-10-CM | POA: Insufficient documentation

## 2024-08-04 SURGERY — COLONOSCOPY
Anesthesia: General

## 2024-08-04 MED ORDER — SODIUM CHLORIDE 0.9 % IV SOLN
INTRAVENOUS | Status: DC
  Administered 2024-08-04: 20 mL/h via INTRAVENOUS

## 2024-08-04 MED ORDER — DEXMEDETOMIDINE HCL IN NACL 80 MCG/20ML IV SOLN
INTRAVENOUS | Status: DC | PRN
  Administered 2024-08-04 (×2): 8 ug via INTRAVENOUS

## 2024-08-04 MED ORDER — PROPOFOL 500 MG/50ML IV EMUL
INTRAVENOUS | Status: DC | PRN
  Administered 2024-08-04: 150 ug/kg/min via INTRAVENOUS

## 2024-08-04 MED ORDER — PROPOFOL 10 MG/ML IV BOLUS
INTRAVENOUS | Status: AC
  Filled 2024-08-04: qty 20

## 2024-08-04 MED ORDER — PROPOFOL 10 MG/ML IV BOLUS
INTRAVENOUS | Status: DC | PRN
  Administered 2024-08-04 (×2): 50 mg via INTRAVENOUS

## 2024-08-04 MED ORDER — LIDOCAINE HCL (CARDIAC) PF 100 MG/5ML IV SOSY
PREFILLED_SYRINGE | INTRAVENOUS | Status: DC | PRN
  Administered 2024-08-04: 60 mg via INTRAVENOUS

## 2024-08-04 NOTE — Anesthesia Procedure Notes (Signed)
 Procedure Name: MAC Date/Time: 08/04/2024 11:43 AM  Performed by: Lorrene Camelia LABOR, CRNAPre-anesthesia Checklist: Patient identified, Emergency Drugs available, Suction available and Patient being monitored Patient Re-evaluated:Patient Re-evaluated prior to induction Oxygen Delivery Method: Simple face mask Preoxygenation: Pre-oxygenation with 100% oxygen Induction Type: IV induction Comments: POM

## 2024-08-04 NOTE — Transfer of Care (Signed)
 Immediate Anesthesia Transfer of Care Note  Patient: Andrea Arnold  Procedure(s) Performed: COLONOSCOPY  Patient Location: Endoscopy Unit  Anesthesia Type:General  Level of Consciousness: drowsy and patient cooperative  Airway & Oxygen Therapy: Patient Spontanous Breathing  Post-op Assessment: Report given to RN and Post -op Vital signs reviewed and stable  Post vital signs: Reviewed and stable  Last Vitals:  Vitals Value Taken Time  BP 80/51 08/04/24 12:08  Temp 36.1 C 08/04/24 12:08  Pulse 81 08/04/24 12:08  Resp 13 08/04/24 12:08  SpO2 98 % 08/04/24 12:08  Vitals shown include unfiled device data.  Last Pain:  Vitals:   08/04/24 1207  TempSrc:   PainSc: Asleep         Complications: No notable events documented.

## 2024-08-04 NOTE — Op Note (Signed)
 [facility]enter[facility] Patient Name: Andrea Arnold Procedure Date: 08/04/2024 11:30 AM MRN: [id number] Account #: [id number] Date of Birth: 1961/07/04 Admit Type: Outpatient Age: 63 Room: [facility] ENDO ROOM 2 Gender: Female Note Status: Finalized Instrument Name: Colon Scope [id number] Procedure:             Colonoscopy Indications:           Screening for colorectal malignant neoplasm, Last                         colonoscopy 10 years ago Providers:             Corinn Jess Brooklyn MD, MD Referring MD:          Corinn Jess Brooklyn MD, MD (Referring MD), Lynwood FALCON.                         Valora, MD (Referring MD) Medicines:             General Anesthesia Complications:         No immediate complications. Estimated blood loss: None. Procedure:             Pre-Anesthesia Assessment:                        - Prior to the procedure, a History and Physical was                         performed, and patient medications and allergies were                         reviewed. The patient is competent. The risks and                         benefits of the procedure and the sedation options and                         risks were discussed with the patient. All questions                         were answered and informed consent was obtained.                         Patient identification and proposed procedure were                         verified by the physician, the nurse, the                         anesthesiologist, the anesthetist and the technician                         in the pre-procedure area in the procedure room in the                         endoscopy suite. Mental Status Examination: alert and                         oriented. Airway Examination: normal oropharyngeal  airway and neck mobility. Respiratory Examination:                         clear to auscultation. CV Examination: normal.                         Prophylactic Antibiotics: The  patient does not require                         prophylactic antibiotics. Prior Anticoagulants: The                         patient has taken no anticoagulant or antiplatelet                         agents. ASA Grade Assessment: II - A patient with mild                         systemic disease. After reviewing the risks and                         benefits, the patient was deemed in satisfactory                         condition to undergo the procedure. The anesthesia                         plan was to use general anesthesia. Immediately prior                         to administration of medications, the patient was                         re-assessed for adequacy to receive sedatives. The                         heart rate, respiratory rate, oxygen saturations,                         blood pressure, adequacy of pulmonary ventilation, and                         response to care were monitored throughout the                         procedure. The physical status of the patient was                         re-assessed after the procedure.                        After obtaining informed consent, the colonoscope was                         passed under direct vision. Throughout the procedure,                         the patient's blood pressure, pulse, and oxygen  saturations were monitored continuously. The                         Colonoscope was introduced through the anus and                         advanced to the the cecum, identified by appendiceal                         orifice and ileocecal valve. The colonoscopy was                         performed with moderate difficulty due to significant                         looping and the patient's body habitus. Successful                         completion of the procedure was aided by applying                         abdominal pressure. The patient tolerated the                         procedure well. The quality  of the bowel preparation                         was evaluated using the BBPS [facility] Bowel Preparation                         Scale) with scores of: Right Colon = 3, Transverse                         Colon = 3 and Left Colon = 3 (entire mucosa seen well                         with no residual staining, small fragments of stool or                         opaque liquid). The total BBPS score equals 9. The                         ileocecal valve, appendiceal orifice, and rectum were                         photographed. Findings:      The perianal and digital rectal examinations were normal. Pertinent       negatives include normal sphincter tone and no palpable rectal lesions.      The entire examined colon appeared normal.      The retroflexed view of the distal rectum and anal verge was normal and       showed no anal or rectal abnormalities. Impression:            - The entire examined colon is normal.                        - The distal rectum and anal verge  are normal on                         retroflexion view.                        - No specimens collected. Recommendation:        - Discharge patient to home (with escort).                        - Resume previous diet today.                        - Continue present medications.                        - Repeat colonoscopy in 10 years for screening                         purposes. Procedure Code(s):     --- Professional ---                        H9878, Colorectal cancer screening; colonoscopy on                         individual not meeting criteria for high risk Diagnosis Code(s):     --- Professional ---                        Z12.11, Encounter for screening for malignant neoplasm                         of colon CPT copyright 2022 American Medical Association. All rights reserved. The codes documented in this report are preliminary and upon coder review may  be revised to meet current compliance requirements. Dr. Corinn Brooklyn Corinn Jess Brooklyn MD, MD 08/04/2024 12:03:45 PM This report has been signed electronically. Number of Addenda: 0 Note Initiated On: 08/04/2024 11:30 AM Scope Withdrawal Time: 0 hours 10 minutes 55 seconds  Total Procedure Duration: 0 hours 14 minutes 11 seconds  Estimated Blood Loss:  Estimated blood loss: none.      [facility]

## 2024-08-04 NOTE — Anesthesia Postprocedure Evaluation (Signed)
 Anesthesia Post Note  Patient: Andrea Arnold  Procedure(s) Performed: COLONOSCOPY  Patient location during evaluation: Endoscopy Anesthesia Type: General Level of consciousness: awake and alert Pain management: pain level controlled Vital Signs Assessment: post-procedure vital signs reviewed and stable Respiratory status: spontaneous breathing, nonlabored ventilation, respiratory function stable and patient connected to nasal cannula oxygen Cardiovascular status: blood pressure returned to baseline and stable Postop Assessment: no apparent nausea or vomiting Anesthetic complications: no   No notable events documented.   Last Vitals:  Vitals:   08/04/24 1208 08/04/24 1217  BP: (!) 90/55 113/81  Pulse: 84 75  Resp:  16  Temp: (!) 36.1 C   SpO2: 99% 100%    Last Pain:  Vitals:   08/04/24 1217  TempSrc:   PainSc: 0-No pain                 Lendia LITTIE Mae

## 2024-08-04 NOTE — H&P (Signed)
 [name], MD [facility] Gastroenterology, DHIP [address]  [place], [place] [address]  Main: [phone] Fax:  [phone] Pager: [phone]   Primary Care Physician:  Valora Lynwood FALCON, MD Primary Gastroenterologist:  Dr. [name]  Pre-Procedure History & Physical: HPI:  Andrea Arnold is a 63 y.o. female is here for an colonoscopy.   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   Chicken pox    Complication of anesthesia    disoriented and took a long time to feel normal after anesthesia   Hyperlipidemia    Hypertension     Past Surgical History:  Procedure Laterality Date   ABLATION  2004   CERVICAL BIOPSY  W/ LOOP ELECTRODE EXCISION  2015   COLONOSCOPY     CYSTOSCOPY W/ RETROGRADES Bilateral 08/31/2018   Procedure: CYSTOSCOPY WITH RETROGRADE PYELOGRAM;  Surgeon: Penne Knee, MD;  Location: [facility] ORS;  Service: Urology;  Laterality: Bilateral;   CYSTOSCOPY WITH FULGERATION  08/31/2018   Procedure: CYSTOSCOPY WITH FULGERATION;  Surgeon: Penne Knee, MD;  Location: [facility] ORS;  Service: Urology;;   [facility] ABLATION     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 Authorizing Provider  Cholecalciferol (VITAMIN D3 PO) Take 1 capsule by mouth at bedtime.    Yes [provider]  clotrimazole-betamethasone (LOTRISONE) cream Apply 1 application topically 2 (two) times daily as needed (for rash/irritated skin- to affected site(s)).    Yes [provider]  losartan (COZAAR) 25 MG tablet Take 25 mg by mouth daily. 11/20/20  Yes [provider]  Multiple Vitamin (MULTIVITAMIN) tablet Take 1 tablet by mouth daily.   Yes [provider]  rosuvastatin (CRESTOR) 5 MG tablet Take 5 mg by mouth at bedtime.  09/28/18  Yes [provider]  topiramate (TOPAMAX) 25 MG tablet Take 1 tablet by mouth 2 (two) times daily. 03/13/23 03/12/24  [provider]    Allergies as of 06/03/2024 - Review Complete 12/17/2023  Allergen Reaction Noted   Ivp dye [iodinated contrast media] Nausea Only 08/31/2018    Family History  Problem Relation Age of Onset   Hyperlipidemia Mother    Hypertension Father    Skin cancer Father        melanoma   Alcohol abuse Sister    Drug abuse Sister    Ovarian cancer Sister 6   Mental illness Sister    Breast cancer Maternal Aunt 92       dx late 26s   Hyperlipidemia Maternal Grandmother    Heart disease Maternal Grandmother    Hypertension Maternal Grandmother    Hyperlipidemia Maternal Grandfather    Heart disease Maternal Grandfather    Hypertension Maternal Grandfather    Breast cancer Cousin 49       dx late 72s    Social History   Socioeconomic History   Marital status: Married    Spouse name: Not on file   Number of children: Not on file   Years of education: Not on file   Highest education level: Not on file  Occupational History   Not on file  Tobacco Use   Smoking status: Former    Current packs/day: 1.00  Average packs/day: 1 pack/day for 10.0 years (10.0 ttl pk-yrs)    Types: Cigarettes   Smokeless tobacco: Never  Vaping Use   Vaping status: Never Used  Substance and Sexual Activity   Alcohol use: Yes    Alcohol/week: 4.0 standard drinks of alcohol    Types: 4 Glasses of wine per week   Drug use: No   Sexual activity: Yes    Birth control/protection: Post-menopausal  Other Topics Concern   Not on file  Social History Narrative   Lives in [place] with husband and 2 daughters. 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 [employer]   Diet - Healthy generally   Exercise - Daily, [place], or walks   Social Drivers of Manufacturing engineer Strain: Low Risk  (11/18/2023)   Received from [facility]   Overall Financial Resource Strain (CARDIA)    Difficulty of Paying Living Expenses: Not hard at all  Food Insecurity: No Food Insecurity (11/18/2023)   Received from [facility]   Hunger Vital Sign    Within the past 12 months, you worried that your food would run out before you got the money to buy more.: Never true    Within the past 12 months, the food you bought just didn't last and you didn't have money to get more.: Never true  Transportation Needs: No Transportation Needs (01/09/2023)   Received from [facility] - Transportation    In the past 12 months, has lack of transportation kept you from medical appointments or from getting medications?: No    Lack of Transportation (Non-Medical): No  Physical Activity: Not on file  Stress: No Stress Concern Present (11/18/2023)   Received from [facility] - Occupational Stress Questionnaire    Feeling of Stress : Not at all  Social Connections: Not on file  Intimate Partner Violence: Not on file    Review of Systems: See HPI, otherwise negative ROS  Physical Exam: BP (!) 146/81   Pulse 68   Temp (!) 96.5 F (35.8 C) (Temporal)   Resp 20   Ht 5' 6 (1.676 m)   Wt 83.7 kg   SpO2 100%   BMI 29.80 kg/m  General:   Alert,  pleasant and cooperative in NAD Head:  Normocephalic and atraumatic. Neck:  Supple; no masses or thyromegaly. Lungs:  Clear throughout to auscultation.    Heart:  Regular rate and rhythm. Abdomen:  Soft, nontender and nondistended. Normal bowel sounds, without guarding, and without rebound.   Neurologic:  Alert and  oriented x4;  grossly normal neurologically.  Impression/Plan: Andrea Arnold is here for an colonoscopy to be performed for colon cancer screening  Risks, benefits, limitations, and  alternatives regarding  colonoscopy have been reviewed with the patient.  Questions have been answered.  All parties agreeable.   Corinn Brooklyn, MD  08/04/2024, 10:15 AM

## 2024-08-04 NOTE — Anesthesia Preprocedure Evaluation (Signed)
 Anesthesia Evaluation  Patient identified by MRN, date of birth, ID band Patient awake    Reviewed: Allergy & Precautions, NPO status , Patient's Chart, lab work & pertinent test results  History of Anesthesia Complications (+) history of anesthetic complications  Airway Mallampati: III  TM Distance: >3 FB Neck ROM: full    Dental no notable dental hx.    Pulmonary neg pulmonary ROS, former smoker   Pulmonary exam normal        Cardiovascular hypertension, On Medications negative cardio ROS Normal cardiovascular exam     Neuro/Psych negative neurological ROS  negative psych ROS   GI/Hepatic negative GI ROS, Neg liver ROS,,,  Endo/Other  negative endocrine ROS    Renal/GU negative Renal ROS  negative genitourinary   Musculoskeletal   Abdominal   Peds  Hematology negative hematology ROS (+)   Anesthesia Other Findings Past Medical History: 2019: Cancer (HCC)     Comment:  Bladder No date: Chicken pox No date: Complication of anesthesia     Comment:  disoriented and took a long time to feel normal after               anesthesia No date: Hyperlipidemia No date: Hypertension  Past Surgical History: 2004: ABLATION 2015: CERVICAL BIOPSY  W/ LOOP ELECTRODE EXCISION No date: COLONOSCOPY 08/31/2018: CYSTOSCOPY W/ RETROGRADES; Bilateral     Comment:  Procedure: CYSTOSCOPY WITH RETROGRADE PYELOGRAM;                Surgeon: Penne Knee, MD;  Location: [facility];                Service: Urology;  Laterality: Bilateral; 08/31/2018: CYSTOSCOPY WITH FULGERATION     Comment:  Procedure: CYSTOSCOPY WITH FULGERATION;  Surgeon:               Penne Knee, MD;  Location: [facility];  Service:               Urology;; No date: NOVASURE ABLATION 08/31/2018: TRANSURETHRAL RESECTION OF BLADDER TUMOR WITH MITOMYCIN - C; N/A     Comment:  Procedure: TRANSURETHRAL RESECTION OF BLADDER TUMOR WITH              Gemcitabine;   Surgeon: Penne Knee, MD;  Location:               [facility];  Service: Urology;  Laterality: N/A;     Reproductive/Obstetrics negative OB ROS                              Anesthesia Physical Anesthesia Plan  ASA: 2  Anesthesia Plan: General   Post-op Pain Management: Minimal or no pain anticipated   Induction: Intravenous  PONV Risk Score and Plan: 2 and Propofol  infusion and TIVA  Airway Management Planned: Natural Airway and Nasal Cannula  Additional Equipment:   Intra-op Plan:   Post-operative Plan:   Informed Consent: I have reviewed the patients History and Physical, chart, labs and discussed the procedure including the risks, benefits and alternatives for the proposed anesthesia with the patient or authorized representative who has indicated his/her understanding and acceptance.     Dental Advisory Given  Plan Discussed with: Anesthesiologist, CRNA and Surgeon  Anesthesia Plan Comments: (Patient consented for risks of anesthesia including but not limited to:  - adverse reactions to medications - risk of airway placement if required - damage to eyes, teeth, lips or other oral mucosa - nerve  damage due to positioning  - sore throat or hoarseness - Damage to heart, brain, nerves, lungs, other parts of body or loss of life  Patient voiced understanding and assent.)        Anesthesia Quick Evaluation

## 2024-11-17 NOTE — Progress Notes (Signed)
" ° °  11/24/2024 3:59 PM   Phinley Schall [place] 14-Oct-1961 [id number]  Cystoscopy Procedure Note:  Indication:  Hx of 1 cm LGTa  - initial TURBT (2019), near Left UO   - Last cysto (Feb 2025) - NED  - hx of [facility]  After informed consent and discussion of the procedure and its risks, Noely Kuhnle was positioned and prepped in the standard fashion. Cystoscopy was performed with a flexible cystoscope. The external vaginal anatomy, urethra, bladder neck and bladder mucosa were visualized in a systematic fashion. The ureteral orifices were noted in orthotopic location and orientation.  There was a subcentimeter low-grade appearing papillary lesion along the anterior bladder dome, along the midline.  I did not see any other lesions or concerning mucosal changes.  Offered her in office fulguration versus TURBT and pathology confirmation.  She preferred fulguration which I fully support given the low-grade appearance and her prior history.  As such, we transition to sterile water and a Bugbee electrode.  The small papillary lesion was completely fulgurated.  Hemostasis was excellent.  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

## 2024-11-24 ENCOUNTER — Encounter: Payer: Self-pay | Admitting: Urology

## 2024-11-24 ENCOUNTER — Ambulatory Visit: Admitting: Urology

## 2024-11-24 VITALS — BP 143/84 | HR 97 | Ht 66.0 in | Wt 188.6 lb

## 2024-11-24 DIAGNOSIS — R3129 Other microscopic hematuria: Secondary | ICD-10-CM

## 2024-11-24 LAB — URINALYSIS, COMPLETE
Bilirubin, UA: NEGATIVE
Glucose, UA: NEGATIVE
Ketones, UA: NEGATIVE
Nitrite, UA: NEGATIVE
Protein,UA: NEGATIVE
Specific Gravity, UA: 1.02 (ref 1.005–1.030)
Urobilinogen, Ur: 0.2 mg/dL (ref 0.2–1.0)
pH, UA: 6.5 (ref 5.0–7.5)

## 2024-11-24 LAB — MICROSCOPIC EXAMINATION: Epithelial Cells (non renal): >10 /HPF — AB (ref 0–10)

## 2024-11-24 MED ORDER — LIDOCAINE HCL URETHRAL/MUCOSAL 2 % EX GEL: 1.0000 | Freq: Once | CUTANEOUS | Status: AC

## 2024-11-24 NOTE — Addendum Note (Signed)
 Addended by: Giulian Goldring E on: 11/24/2024 10:40 AM   Modules accepted: Orders

## 2024-12-13 ENCOUNTER — Other Ambulatory Visit: Admitting: Urology

## 2024-12-14 ENCOUNTER — Other Ambulatory Visit: Payer: BC Managed Care – PPO | Admitting: Urology

## 2025-06-24 ENCOUNTER — Other Ambulatory Visit: Admitting: Urology
